# Patient Record
Sex: Female | Born: 1995 | Race: White | Hispanic: No | Marital: Single | Smoking: Never smoker
Health system: Southern US, Community
[De-identification: ages and names within clinical notes are randomized; demographics above are authoritative.]

## PROBLEM LIST (undated history)

## (undated) ENCOUNTER — Emergency Department: Discharge status: Absent without leave | Payer: No Typology Code available for payment source

## (undated) DIAGNOSIS — I10 Essential (primary) hypertension: Secondary | ICD-10-CM

## (undated) DIAGNOSIS — J45909 Unspecified asthma, uncomplicated: Secondary | ICD-10-CM

## (undated) DIAGNOSIS — R55 Syncope and collapse: Secondary | ICD-10-CM

## (undated) DIAGNOSIS — G90A Postural orthostatic tachycardia syndrome (POTS): Secondary | ICD-10-CM

## (undated) DIAGNOSIS — Q796 Ehlers-Danlos syndrome, unspecified: Secondary | ICD-10-CM

## (undated) DIAGNOSIS — Z8619 Personal history of other infectious and parasitic diseases: Secondary | ICD-10-CM

## (undated) DIAGNOSIS — R51 Headache: Secondary | ICD-10-CM

## (undated) DIAGNOSIS — B001 Herpesviral vesicular dermatitis: Secondary | ICD-10-CM

## (undated) DIAGNOSIS — H539 Unspecified visual disturbance: Secondary | ICD-10-CM

## (undated) DIAGNOSIS — F419 Anxiety disorder, unspecified: Secondary | ICD-10-CM

## (undated) DIAGNOSIS — M359 Systemic involvement of connective tissue, unspecified: Secondary | ICD-10-CM

## (undated) DIAGNOSIS — Z8669 Personal history of other diseases of the nervous system and sense organs: Secondary | ICD-10-CM

## (undated) DIAGNOSIS — T7840XA Allergy, unspecified, initial encounter: Secondary | ICD-10-CM

## (undated) DIAGNOSIS — M6751 Plica syndrome, right knee: Secondary | ICD-10-CM

## (undated) HISTORY — DX: Plica syndrome, right knee: M67.51

## (undated) HISTORY — DX: Allergy, unspecified, initial encounter: T78.40XA

## (undated) HISTORY — DX: Anxiety disorder, unspecified: F41.9

## (undated) HISTORY — DX: Herpesviral vesicular dermatitis: B00.1

## (undated) HISTORY — DX: Unspecified visual disturbance: H53.9

## (undated) HISTORY — DX: Headache: R51

## (undated) HISTORY — PX: KNEE SURGERY: SHX244

---
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
## 2016-01-17 ENCOUNTER — Encounter: Payer: Self-pay | Admitting: Pediatrics

## 2016-01-18 ENCOUNTER — Encounter: Payer: Self-pay | Admitting: Pediatrics

## 2016-01-23 ENCOUNTER — Telehealth: Payer: Self-pay

## 2016-01-23 NOTE — Telephone Encounter (Signed)
[facility] - Ambulatory Telephone Intake Screen     Patient Information:    Patient's Name: Jalacia Cowling  Patient's Date of Birth: [birth year]  Patient's Medical Record Number: [id number]  Patient's Home Phone: [phone] (home)  Patient's Work Phone: [phone] (work)  [employer]:   No relevant phone numbers on file.     Patient's Marital Status (if applicable): Single  Can a message be left? Yes      Insurance Information:    Community education officer via [employer]  Policy number:       Referral Source:  Referral Source: [employer] counseling      Presenting Concern:     What is the reason you are seeking care?     (Please provide a brief description of the reasons the patient or referring provider is seeking mental health treatment at this time.  Please use patients own words where possible).    A few years ago I was diagnosed with major depressive disorder, generalized anxiety, & ptsd. When I moved for school I didn't have a counselor or doctor for a whole semester & my mental health got bad. I was in inpatient for a while & then sent back to the counselors at my school & so far none of them have been able to help me.                                    Mental Health History:    Are you currently involved in mental health treatment?  No     Are you currently taking a long acting injectable medication?  No    Will you need an injection at the time of your first intake assessment appointment?   No      Customer Service:    Do you need arrangements for?    Wheelchair: No    Interpreter Services: No

## 2016-02-01 ENCOUNTER — Ambulatory Visit: Payer: Self-pay

## 2016-02-01 DIAGNOSIS — F4312 Post-traumatic stress disorder, chronic: Secondary | ICD-10-CM

## 2016-02-01 DIAGNOSIS — F329 Major depressive disorder, single episode, unspecified: Secondary | ICD-10-CM
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
## 2016-02-15 ENCOUNTER — Ambulatory Visit: Payer: Self-pay

## 2016-02-15 DIAGNOSIS — F4312 Post-traumatic stress disorder, chronic: Secondary | ICD-10-CM

## 2016-02-15 DIAGNOSIS — F32A Depression, unspecified: Secondary | ICD-10-CM

## 2016-02-15 DIAGNOSIS — F411 Generalized anxiety disorder: Secondary | ICD-10-CM

## 2016-02-15 DIAGNOSIS — F603 Borderline personality disorder: Secondary | ICD-10-CM

## 2016-02-15 NOTE — Progress Notes (Signed)
[facility] Safety Plan     Things that cause you to feel stressed:  arguments, being called a name, being isolated/alone, being touched, feeling lonely, feeling worried, not being listened to, people yelling and seeing others out of control    Warning signs:  breathing hard, clenching teeth, hurting others/property, hurting yourself, hyper, isolating/avoiding behavior, not taking care of self, pacing, swearing and sweating/racing heartbeat/breathing hard    Coping strategies:  being around others, breathing exercises, coloring/drawing, crying, exercising, listening to music, meditation/yoga, praying/meditation, reading, screaming in a pillow, snuggling in a blanket, taking a shower, talking to friends/family, using humor, watching television, working, writing down my feelings and writing in a journal    People whom I can ask for help:  Family member:mitch ; phone: [phone]; Ladona MowSarah, ph. [phone]; Luther ParodySidney, [phone]  Keeping a safe environment:  staying with family/friend, removing weapon or firearm from home, removing access to sharp objects and removing alcohol from home    Professionals or agencies I can contact during a crisis:  Clinician Name: Melton Krebsracy Collins ; Phone: [phone] ; De HollingsheadLorrie Earnesteen Birnie [phone]    Clinician Emergency Contact number/Answering Service number: [phone]  [place] OR MOBILE CRISIS: [phone] or [phone]  SUICIDE PREVENTION [place] PHONE: [phone]  POLICE: 911    IF NEEDED, GO TO THE HOSPITAL EMERGENCY ROOM      Patient Signature: ______________________________________________________      Clinician Signature: _____________________________________________________

## 2016-02-16 NOTE — Progress Notes (Signed)
Behavioral Health Progress Note     LENGTH OF SESSION: 45 minutes    Contact Type:  Location: On Site    Face to Face     Problem(s)/Goals Addressed from Treatment Plan:    Problem 1: No flowsheet data found.    Goal for this problem:  No flowsheet data found.    Progress towards this goal: N/A - Initial plan      Mental Status Exam:  APPEARANCE: Appears stated age  ATTITUDE TOWARD INTERVIEWER: Cooperative  MOTOR ACTIVITY: WNL (within normal limits)  EYE CONTACT: Direct  SPEECH: Age appropriate and Rapid  AFFECT: Appropriate  MOOD: Dramatic  THOUGHT PROCESS: Goal directed  THOUGHT CONTENT: No unusual themes  PERCEPTION: Within normal limits  CURRENT SUICIDAL IDEATION: Vague/passive suicidal ideation, Without suicidal plan and Without Intent  CURRENT HOMICIDAL IDEATION: Patient denies  ORIENTATION: Alert and Oriented X 3.  CONCENTRATION: Fair  MEMORY:   Recent: intact   Remote: intact  COGNITIVE FUNCTION: Average intelligence  JUDGMENT: Intact  IMPULSE CONTROL: Good  INSIGHT: Age appropriate    Risk Assessment:  ASSESSMENT OF RISK FOR SUICIDAL BEHAVIOR  Changes in risk for suicide from baseline Formulation of Risk and/or previous intake, including newly identified risk, if any: none      Session Content::  Client came to session identifying specific goals which include developing boundaries with her family and friends.  Increasing her ability to handle distressing situations more effectively.  She also discussed with Clinical research associatewriter that she would like to decrease her anxiety and depression.  We discussed DBT and CBT as a thought challenging intervention and a skill building intervention to her goals.  The session was based on treatment planning. Client stated that she had a "good" week.  We did a safety plan and discussed how she could prepare for the upcoming semester.  Writer explained that she would have to certify her dog with a trainer, but that writer would endorse the therapeutic value of having the dog with her.   Will continue weekly sessions.    Visit Diagnosis:    ICD-10-CM ICD-9-CM   1. Depression F32.9 311   2. GAD (generalized anxiety disorder) F41.1 300.02   3. Chronic post-traumatic stress disorder (PTSD) F43.12 309.81   4. Borderline personality disorder F60.3 301.83       Interventions:  Cognitive Processing Therapy, Continued to develop therapeutic relationship, Dialectical Behavioral Therapy (DBT), Problem Solving Therapy skills, Provided Psychoeducation, Safety Planning.  Refer to safety plan document that was created at today's visit., Treatment Planning    Current Treatment Plan        Plan:  Psychotherapy continues as described in care plan; plan remains the same.    NEXT APPT: Aug 30th @ 4:00      Chaney MallingLORRIE A Yoni Jimenez

## 2016-02-21 ENCOUNTER — Ambulatory Visit: Payer: Self-pay

## 2016-02-21 DIAGNOSIS — F419 Anxiety disorder, unspecified: Secondary | ICD-10-CM

## 2016-02-21 DIAGNOSIS — F329 Major depressive disorder, single episode, unspecified: Secondary | ICD-10-CM

## 2016-02-21 DIAGNOSIS — F4310 Post-traumatic stress disorder, unspecified: Secondary | ICD-10-CM

## 2016-02-21 DIAGNOSIS — F603 Borderline personality disorder: Secondary | ICD-10-CM

## 2016-02-22 NOTE — Progress Notes (Signed)
Behavioral Health Progress Note     LENGTH OF SESSION: 45 minutes    Contact Type:  Location: On Site    Face to Face     Problem(s)/Goals Addressed from Treatment Plan:    Problem 1: No flowsheet data found.    Goal for this problem:  No flowsheet data found.    Progress towards this goal: N/A - Initial plan      Mental Status Exam:  APPEARANCE: Appears stated age  ATTITUDE TOWARD INTERVIEWER: Cooperative  MOTOR ACTIVITY: WNL (within normal limits)  EYE CONTACT: Direct  SPEECH: Normal rate and tone  AFFECT: Appropriate  MOOD: Anxious  THOUGHT PROCESS: Intact  THOUGHT CONTENT: No unusual themes  PERCEPTION: Within normal limits  CURRENT SUICIDAL IDEATION: Without suicidal plan and Without Intent  CURRENT HOMICIDAL IDEATION: Patient denies  ORIENTATION: Alert and Oriented X 3.  CONCENTRATION: WNL  MEMORY:   Recent: intact   Remote: intact  COGNITIVE FUNCTION: Average intelligence  JUDGMENT: Intact  IMPULSE CONTROL: Intact  INSIGHT: Age appropriate    Risk Assessment:  ASSESSMENT OF RISK FOR SUICIDAL BEHAVIOR  Changes in risk for suicide from baseline Formulation of Risk and/or previous intake, including newly identified risk, if any: none      Session Content::  Client brought in a form from [employer] asking for my assessment and why I felt that her dog would be therapeutic.  Writer stated that she felt it would be beneficial in decreasing lethality risk, decreasing depressive symptoms and relieving anxiety.  Writer explained that she could not certify or verify documentation, but was able to give professional assessment.   Client states that she had a very good week did her homework and used her skills.  Client begins school next week and has anxiety surrounding that.  We processed and planned for coping strategies.     Visit Diagnosis:      ICD-10-CM ICD-9-CM   1. Borderline personality disorder F60.3 301.83   2. Major depression F32.9 296.20   3. Anxiety F41.9 300.00   4. PTSD (post-traumatic stress disorder) F43.10  309.81       Interventions:  Cognitive Processing Therapy, Problem Solving Therapy skills, Provided Psychoeducation    Current Treatment Plan        Plan:  Other planned interventions or recommendations: documentation of tx plan    NEXT APPT: 1 week    Kimm Ungaro A Jabbar Palmero

## 2016-03-13 ENCOUNTER — Telehealth: Payer: Self-pay

## 2016-03-13 ENCOUNTER — Ambulatory Visit: Payer: Self-pay

## 2016-03-13 NOTE — Telephone Encounter (Signed)
Writer called client as follow up to missed appointment, client stated she got stuck in lab at [employer] and will reschedule

## 2016-03-21 ENCOUNTER — Emergency Department
Admission: EM | Admit: 2016-03-21 | Discharge: 2016-03-22 | Disposition: A | Discharge status: Home / Self Care | Payer: Self-pay | Source: Ambulatory Visit | Attending: Emergency Medicine | Admitting: Emergency Medicine

## 2016-03-21 DIAGNOSIS — E860 Dehydration: Secondary | ICD-10-CM | POA: Insufficient documentation

## 2016-03-21 DIAGNOSIS — R112 Nausea with vomiting, unspecified: Secondary | ICD-10-CM | POA: Insufficient documentation

## 2016-03-21 DIAGNOSIS — R109 Unspecified abdominal pain: Secondary | ICD-10-CM | POA: Insufficient documentation

## 2016-03-21 LAB — POCT URINALYSIS DIPSTICK
Blood,UA POCT: NEGATIVE
Exp date: 2017
Glucose,UA POCT: NORMAL
Ketones,UA POCT: NEGATIVE
Leuk Esterase,UA POCT: NEGATIVE
Lot #: 16609201
Nitrite,UA POCT: NEGATIVE
PH,UA POCT: 5 (ref 5–8)
Protein,UA POCT: NEGATIVE mg/dL

## 2016-03-21 LAB — CBC AND DIFFERENTIAL
Baso # K/uL: 0.1 10*3/uL (ref 0.0–0.1)
Basophil %: 0.3 %
Eos # K/uL: 0.1 10*3/uL (ref 0.0–0.4)
Eosinophil %: 0.5 %
Hematocrit: 37 % (ref 34–45)
Hemoglobin: 12.2 g/dL (ref 11.2–15.7)
IMM Granulocytes #: 0.1 10*3/uL (ref 0.0–0.1)
IMM Granulocytes: 0.5 %
Lymph # K/uL: 1.1 10*3/uL — ABNORMAL LOW (ref 1.2–3.7)
Lymphocyte %: 6.6 %
MCH: 25 pg — ABNORMAL LOW (ref 26–32)
MCHC: 33 g/dL (ref 32–36)
MCV: 77 fL — ABNORMAL LOW (ref 79–95)
Mono # K/uL: 0.8 10*3/uL (ref 0.2–0.9)
Monocyte %: 4.5 %
Neut # K/uL: 15 10*3/uL — ABNORMAL HIGH (ref 1.6–6.1)
Nucl RBC # K/uL: 0 10*3/uL (ref 0.0–0.0)
Nucl RBC %: 0 /100 WBC (ref 0.0–0.2)
Platelets: 246 10*3/uL (ref 160–370)
RBC: 4.8 MIL/uL (ref 3.9–5.2)
RDW: 13.9 % (ref 11.7–14.4)
Seg Neut %: 87.6 %
WBC: 17.1 10*3/uL — ABNORMAL HIGH (ref 4.0–10.0)

## 2016-03-21 LAB — POCT URINE PREGNANCY
Exp date: 20
Lot #: 122677

## 2016-03-21 NOTE — ED Notes (Signed)
Patient presents to ED with c/o of RLQ and LLQ abdominal pain, nausea, vomiting, diarrhea, joint pain and sore throat for the last 2 weeks.

## 2016-03-21 NOTE — ED Notes (Signed)
Requested a urine sample, pt ambulated to the bathroom on own accord.

## 2016-03-22 ENCOUNTER — Encounter: Payer: Self-pay | Admitting: Emergency Medicine

## 2016-03-22 LAB — BASIC METABOLIC PANEL
Anion Gap: 16 (ref 7–16)
CO2: 22 mmol/L (ref 20–28)
Calcium: 9.5 mg/dL (ref 9.0–10.4)
Chloride: 102 mmol/L (ref 96–108)
Creatinine: 0.83 mg/dL (ref 0.50–1.00)
GFR,Black: 118 *
GFR,Caucasian: 102 *
Glucose: 98 mg/dL (ref 60–99)
Lab: 9 mg/dL (ref 6–20)
Potassium: 3.9 mmol/L (ref 3.3–5.1)
Sodium: 140 mmol/L (ref 133–145)

## 2016-03-22 LAB — RUQ PANEL (ED ONLY)
ALT: 20 U/L (ref 0–35)
AST: 24 U/L (ref 0–35)
Albumin: 4.5 g/dL (ref 3.5–5.2)
Alk Phos: 81 U/L (ref 50–130)
Amylase: 64 U/L (ref 28–100)
Bilirubin,Direct: <0.2 mg/dL (ref 0.0–0.3)
Bilirubin,Total: 0.6 mg/dL (ref 0.0–1.2)
Globulin: 2.5 g/dL — ABNORMAL LOW (ref 2.7–4.3)
Lipase: 17 U/L (ref 13–60)
Total Protein: 7 g/dL (ref 6.3–7.7)

## 2016-03-22 LAB — CBC AND DIFFERENTIAL
Baso # K/uL: 0 10*3/uL (ref 0.0–0.1)
Basophil %: 0.2 %
Eos # K/uL: 0.1 10*3/uL (ref 0.0–0.4)
Eosinophil %: 0.3 %
Hematocrit: 33 % — ABNORMAL LOW (ref 34–45)
Hemoglobin: 10.9 g/dL — ABNORMAL LOW (ref 11.2–15.7)
IMM Granulocytes #: 0.1 10*3/uL (ref 0.0–0.1)
IMM Granulocytes: 0.7 %
Lymph # K/uL: 1 10*3/uL — ABNORMAL LOW (ref 1.2–3.7)
Lymphocyte %: 6.3 %
MCH: 26 pg (ref 26–32)
MCHC: 33 g/dL (ref 32–36)
MCV: 78 fL — ABNORMAL LOW (ref 79–95)
Mono # K/uL: 0.9 10*3/uL (ref 0.2–0.9)
Monocyte %: 6.2 %
Neut # K/uL: 13.1 10*3/uL — ABNORMAL HIGH (ref 1.6–6.1)
Nucl RBC # K/uL: 0 10*3/uL (ref 0.0–0.0)
Nucl RBC %: 0 /100 WBC (ref 0.0–0.2)
Platelets: 207 10*3/uL (ref 160–370)
RBC: 4.2 MIL/uL (ref 3.9–5.2)
RDW: 14 % (ref 11.7–14.4)
Seg Neut %: 86.3 %
WBC: 15.1 10*3/uL — ABNORMAL HIGH (ref 4.0–10.0)

## 2016-03-22 LAB — RAPID STREP SCREEN: Rapid Strep Group A Throat: 0

## 2016-03-22 LAB — SEDIMENTATION RATE, AUTOMATED: Sedimentation Rate: 8 mm/hr (ref 0–20)

## 2016-03-22 LAB — CRP: CRP: 14 mg/L — ABNORMAL HIGH (ref 0–10)

## 2016-03-22 MED ORDER — MORPHINE SULFATE 4 MG/ML IV SOLN *WRAPPED*
4.0000 mg | Freq: Once | INTRAVENOUS | Status: AC
Start: 2016-03-22 — End: 2016-03-22
  Administered 2016-03-22: 4 mg via INTRAVENOUS
  Filled 2016-03-22: qty 1

## 2016-03-22 MED ORDER — SODIUM CHLORIDE 0.9 % IV BOLUS *I*
1000.0000 mL | Freq: Once | Status: AC
Start: 2016-03-22 — End: 2016-03-22
  Administered 2016-03-22: 1000 mL via INTRAVENOUS

## 2016-03-22 MED ORDER — KETOROLAC TROMETHAMINE 30 MG/ML IJ SOLN *I*
30.0000 mg | Freq: Once | INTRAMUSCULAR | Status: AC
Start: 2016-03-22 — End: 2016-03-22
  Administered 2016-03-22: 30 mg via INTRAVENOUS
  Filled 2016-03-22: qty 1

## 2016-03-22 MED ORDER — IOHEXOL 350 MG/ML (OMNIPAQUE) IV SOLN *I*
1.0000 mL | Freq: Once | INTRAVENOUS | Status: AC
Start: 2016-03-22 — End: 2016-03-22
  Administered 2016-03-22: 114 mL via INTRAVENOUS

## 2016-03-22 MED ORDER — STERILE WATER FOR IRRIGATION IR SOLN *I*
900.0000 mL | Freq: Once | Status: AC
Start: 2016-03-22 — End: 2016-03-22
  Administered 2016-03-22: 900 mL via ORAL

## 2016-03-22 MED ORDER — ONDANSETRON HCL 2 MG/ML IV SOLN *I*
4.0000 mg | Freq: Once | INTRAMUSCULAR | Status: AC
Start: 2016-03-22 — End: 2016-03-22
  Administered 2016-03-22: 4 mg via INTRAVENOUS
  Filled 2016-03-22: qty 2

## 2016-03-22 MED ORDER — PROMETHAZINE HCL 25 MG/ML IJ SOLN *I*
12.5000 mg | Freq: Once | INTRAMUSCULAR | Status: AC
Start: 2016-03-22 — End: 2016-03-22
  Administered 2016-03-22: 12.5 mg via INTRAVENOUS
  Filled 2016-03-22: qty 1

## 2016-03-22 NOTE — ED Notes (Signed)
Patient had large amount of vomiting after drinking contrast. Alveria ApleyM Caccamise NP aware.

## 2016-03-22 NOTE — ED Provider Notes (Signed)
History     Chief Complaint   Patient presents with    Abdominal Pain     pt states was sick a few weeks ago for 24 hours. since then has had cough, abdominal pain, nausea vomiting. today got dizzy and "had a head rush". also states joint pain "throught the charts". states home temp was 102. states neck pain and throat pain for few days.    Joint Pain    Fever    Dizziness     HPI Comments: Patient is a 20 year old female with a history of depression, anxiety, fibromyalgia who presents to the emergency department complaints of abdominal pain.  Patient reports that she's had intermittent abdominal pain for 2 weeks.  Patient reports lower abdominal pain with nausea and vomiting.  Denies any diarrhea.  Patient denies any urinary symptoms or vaginal discharge.  Unknown pregnancy status.  Patient reports fevers, chills, myalgias.  Denies any chest pain or shortness of breath.  Patient also reports that she's had a sore throat for 2 weeks.  Denies any difficulty breathing or swallowing.  Unknown sick contacts.      History provided by:  Patient  Language interpreter used: No      Past Medical History:   Diagnosis Date    Asthma     Ehlers-Danlos syndrome     Hypertension     POTS (postural orthostatic tachycardia syndrome)     Vasovagal syncope         History reviewed. No pertinent surgical history.  History reviewed. No pertinent family history.    Social History    reports that she has never smoked. She does not have any smokeless tobacco history on file. She reports that she drinks alcohol. She reports that she uses illicit drugs, including Marijuana. Her sexual activity history is not on file.    Living Situation     Questions Responses    Patient lives with Alone    Homeless No    Caregiver for other family member No    External Services Mental Health Services    Employment Student    Comment: [employer]     Domestic Violence Risk No          Problem List     Patient Active Problem List   Diagnosis Code     Sprain of left ankle, unspecified ligament, initial encounter S93.402A    Major depress dis, severe F32.2       Review of Systems   Review of Systems   Constitutional: Positive for chills, fatigue and fever. Negative for activity change, appetite change, diaphoresis and unexpected weight change.   HENT: Positive for sore throat. Negative for ear discharge, ear pain, trouble swallowing and voice change.    Eyes: Negative for photophobia and visual disturbance.   Respiratory: Negative for apnea, cough, choking, chest tightness, shortness of breath, wheezing and stridor.    Cardiovascular: Negative for chest pain, palpitations and leg swelling.   Gastrointestinal: Positive for abdominal pain, nausea and vomiting. Negative for abdominal distention, constipation and diarrhea.   Endocrine: Negative.    Genitourinary: Negative for dysuria, flank pain, frequency, hematuria, urgency and vaginal discharge.   Musculoskeletal: Positive for myalgias. Negative for arthralgias, back pain, gait problem, joint swelling, neck pain and neck stiffness.   Skin: Negative for color change, pallor, rash and wound.   Allergic/Immunologic: Negative.    Neurological: Negative for dizziness, weakness, light-headedness and headaches.   Hematological: Negative.    Psychiatric/Behavioral: Negative for self-injury and  suicidal ideas.   All other systems reviewed and are negative.      Physical Exam     ED Triage Vitals   BP Heart Rate Heart Rate (via Pulse Ox) Resp Temp Temp src SpO2 O2 Device O2 Flow Rate   03/21/16 2215 03/21/16 2215 03/22/16 0000 03/21/16 2215 03/21/16 2215 03/21/16 2215 03/21/16 2215 03/21/16 2215 --   132/66 135 109 18 36.7 C (98.1 F) TEMPORAL 99 % None (Room air)       Weight           03/21/16 2215           77.1 kg (170 lb)                    Physical Exam   Constitutional: She is oriented to person, place, and time. She appears well-developed. No distress.   HENT:   Head: Normocephalic.   Mouth/Throat: Oropharynx is  clear and moist. No trismus in the jaw. No oropharyngeal exudate, posterior oropharyngeal edema, posterior oropharyngeal erythema or tonsillar abscesses.   Eyes: Conjunctivae and EOM are normal. Pupils are equal, round, and reactive to light.   Neck: Normal range of motion. Neck supple.   Cardiovascular: Regular rhythm and normal heart sounds.  Tachycardia present.    Pulmonary/Chest: Effort normal and breath sounds normal. No respiratory distress. She has no wheezes. She has no rales. She exhibits no tenderness.   Abdominal: Soft. Bowel sounds are normal. She exhibits no distension. There is tenderness.   Abdomen soft, nondistended, lower abdominal tenderness without rebound or guarding   Genitourinary:   Genitourinary Comments: Declined pelvic exam, declined self swabbing   Musculoskeletal: Normal range of motion. She exhibits no edema, tenderness or deformity.   Neurological: She is alert and oriented to person, place, and time.   Skin: Skin is warm and dry. No rash noted. She is not diaphoretic. No erythema. No pallor.   Psychiatric: She has a normal mood and affect. Her behavior is normal. Judgment and thought content normal.   Nursing note and vitals reviewed.      Medical Decision Making      Amount and/or Complexity of Data Reviewed  Clinical lab tests: ordered and reviewed  Tests in the radiology section of CPT: reviewed and ordered        Initial Evaluation:  ED First Provider Contact     Date/Time Event User Comments    03/21/16 2307 ED Provider First Contact Shanetra Blumenstock Initial Face to Face Provider Contact          Patient seen by me on arrival date of 03/21/2016 at at time of arrival  10:03 PM.  Initial face to face evaluation time noted above may be discrepant due to patient acuity and delay in documentation.    Assessment:  20 y.o.female comes to the ED with Complaint of abdominal pain, nausea, vomiting, sore throat    Differential Diagnosis includes gastroenteritis, intra-abdominal infection,  UTI, STD, PID, strep pharyngitis, pharyngitis, viral illness                      Plan:  Urine/pregnancy  CBC and diff  BMP  Right upper quadrant  CRP  Sed rate  Rapid strep  IV hydration  Morphine  Zofran  Toradol    Labs notable for leukocytosis.  Negative pregnancy.  Labs Reviewed   CBC AND DIFFERENTIAL - Abnormal; Notable for the following:        Result  Value    WBC 17.1 (*)     MCV 77 (*)     MCH 25 (*)     Neut # K/uL 15.0 (*)     Lymph # K/uL 1.1 (*)     All other components within normal limits   RUQ PANEL (ED ONLY) - Abnormal; Notable for the following:     Globulin 2.5 (*)     All other components within normal limits   CRP - Abnormal; Notable for the following:     CRP 14 (*)     All other components within normal limits   CBC AND DIFFERENTIAL - Abnormal; Notable for the following:     WBC 15.1 (*)     Hemoglobin 10.9 (*)     Hematocrit 33 (*)     MCV 78 (*)     Neut # K/uL 13.1 (*)     Lymph # K/uL 1.0 (*)     All other components within normal limits   RAPID STREP SCREEN    Narrative:     Negative for Streptococcus Group A Antigen  Sensitivity for a Rapid Test for GABHS compared to  culture is 80% (Dec. 2016-Feb. 2017 data).  Test Method: Immunoassay   STREP A CULTURE, THROAT    Narrative:     Testing in progress   BASIC METABOLIC PANEL   CRP   SEDIMENTATION RATE, AUTOMATED   SEDIMENTATION RATE, AUTOMATED   POCT URINALYSIS DIPSTICK   POCT URINE PREGNANCY     Plan for CT abdomen and pelvis  CT showed:  Ct Abdomen And Pelvis With Contrast    Result Date: 03/22/2016  Exam Site: [facility] 03/22/2016 3:40 AM  CT ABDOMEN AND PELVIS WITH CONTRAST  ORDERING CLINICAL INFORMATION:  ERECORD: ABDOMINAL PAIN;lower abdominal pain with leukocytosis ADDITIONAL CLINICAL INFORMATION:  None.   COMPARISON:  None.  PROCEDURE: Oral contrast was administered. CT images were acquired from the lung bases through the pelvis after administration of Omnipaque 350 IV contrast. Contrast amount:  114 cc.    Coronal and sagittal reformats were created.  Chest Base findings: Visualized portion of the lung bases are unremarkable.  ABDOMEN FINDINGS:  Liver/Biliary Tract:  No focal liver lesions.  No gallbladder wall thickening. No biliary ductal dilation.   Pancreas: Normal  Spleen: Normal. Small splenule noted.  Adrenals: Normal  Kidneys and Collecting Systems: No hydronephrosis. Symmetric enhancement of bilateral kidneys.  Stomach/Esophagus/Abdominal GI Tract: Stomach is unremarkable. Small bowel is unremarkable.  Mesentery and Peritoneal Cavity: No free air. No free fluid.  Vessels: Normal aortic caliber.  Lymph Nodes: No lymphadenopathy.  Soft Tissues/Musculoskeletal:  No acute bone abnormality.  PELVIC FINDINGS:  Visualized Reproductive Organs: Normal  Bladder: Normal  Lymph Nodes: No lymphadenopathy.  Vessels: Normal  Pelvic GI Tract:  No free fluid. Large bowel is unremarkable.  Normal-appearing appendix.  Soft Tissues/Musculoskeletal:  No acute bone abnormality.       IMPRESSION:  Unremarkable CT of the abdomen and pelvis. No findings to explain patient's abdominal pain.  END REPORT  I have personally reviewed the image(s) and the resident's interpretation and agree with or edited the findings.      Patient received 2 L normal saline.  Patient reports that she feels better.  Repeat WBC decreased from 17.1-15.1.  Patient declined pelvic exam, declined self swabbing.  Patient reports that she has no vaginal discharge and is confident that she does not have an STD.  Patient reports that she feels better  and would like to go home.  Plan to discharge patient home with strict return precautions.  Patient educated to follow up with [facility] and PCP next week.  She is agreeable with this plan and understands return precautions.  Curlene Dolphin, NP    Collaborating physician Rada Hay was immediately available       Curlene Dolphin, NP  03/22/16 2032

## 2016-03-22 NOTE — Discharge Instructions (Signed)
Take tylenol for pain   Stay hydrated     Follow-up with your PCP and return to the ED with worsening symptoms or concerns

## 2016-03-23 LAB — STREP A CULTURE, THROAT: Group A Strep Throat Culture: 0

## 2016-03-25 ENCOUNTER — Ambulatory Visit: Payer: Self-pay

## 2016-03-25 DIAGNOSIS — F329 Major depressive disorder, single episode, unspecified: Secondary | ICD-10-CM

## 2016-03-25 DIAGNOSIS — F603 Borderline personality disorder: Secondary | ICD-10-CM

## 2016-03-25 DIAGNOSIS — F4312 Post-traumatic stress disorder, chronic: Secondary | ICD-10-CM

## 2016-03-26 DIAGNOSIS — F4312 Post-traumatic stress disorder, chronic: Secondary | ICD-10-CM

## 2016-03-26 DIAGNOSIS — F329 Major depressive disorder, single episode, unspecified: Secondary | ICD-10-CM

## 2016-03-26 DIAGNOSIS — F603 Borderline personality disorder: Secondary | ICD-10-CM

## 2016-03-26 NOTE — Progress Notes (Signed)
Behavioral Health Progress Note     LENGTH OF SESSION: 45 minutes    Contact Type:  Location: On Site    Face to Face     Problem(s)/Goals Addressed from Treatment Plan:    Problem 1:   Treatment Problem #1 03/26/2016   Patient Identified Problem Inability to regulate emotions       Goal for this problem:    Treatment Goal #1 03/26/2016   Patient Identified Goal emotion regulation       Progress towards this goal: N/A - Initial planProblem 2:    Treatment Problem #2 03/26/2016   Patient Identified Problem clear boundaries with Family and Friends       Goal for this problem:    Treatment Goal #2 03/26/2016   Patient Identified Goal developing boundaries        Progress towards this goal: N/A - Initial plan  , Problem 3:    Treatment Problem #3 03/26/2016   Patient Identified Problem Depression       Goal for this problem:    Treatment Goal #3 03/26/2016   Patient Identified Goal reduce depression       Progress towards this goal: N/A - Initial plan    ,       Mental Status Exam:  APPEARANCE: Appears stated age  ATTITUDE TOWARD INTERVIEWER: Cooperative  MOTOR ACTIVITY: WNL (within normal limits)  EYE CONTACT: Direct  SPEECH: Normal rate and tone  AFFECT: Anxious, Fearful and Depressed  MOOD: Anxious and Sad  THOUGHT PROCESS: Goal directed  THOUGHT CONTENT: No unusual themes  PERCEPTION: Within normal limits  CURRENT SUICIDAL IDEATION: patient denies  CURRENT HOMICIDAL IDEATION: Patient denies  ORIENTATION: Alert and Oriented X 3.  CONCENTRATION: WNL  MEMORY:   Recent: intact   Remote: intact  COGNITIVE FUNCTION: Average intelligence  JUDGMENT: Intact  IMPULSE CONTROL: Fair  INSIGHT: Age appropriate    Risk Assessment:  ASSESSMENT OF RISK FOR SUICIDAL BEHAVIOR  Changes in risk for suicide from baseline Formulation of Risk and/or previous intake, including newly identified risk, if any: none      Session Content::  Client came to session on time and very dysregulated.  She stated that she was no longer insured and that she  had been very sick for a week.  She stated that there was a mix up with [employer] and that her insurance was a mess. She further stated that she would not be able to be insured by parents while [place] is [place].   Writer first did a mindfulness exercise with client to bring her back into the moment and to calm her.   She was then able to problem solve with Clinical research associate.  We were able to have [employer] take the money out of her financial aid and she was instructed to go ahead to the Dr.and that her insurance would be affective immediately. Client did the negotiation during session with writer there to coach if necessary, but client was able to problem solve effectively.  We spent the rest of the session processing adaptive skill utilization and how ineffective emotion mind was in problem solving.  We then spent some time on how she was coping with anxiety from classes and she stated that she uses mindfulness and breathing and that it has been effective.  We will meet in two weeks.  Writer gave client some homework that included the situation from session and she would identify the thoughts that led to the emotion and the subsequent  behavior.    Visit Diagnosis:    ICD-10-CM ICD-9-CM   1. Chronic post-traumatic stress disorder (PTSD) F43.12 309.81   2. MDD (major depressive disorder) F32.9 296.20   3. Borderline personality disorder F60.3 301.83       Interventions:  Cognitive Processing Therapy, Dialectical Behavioral Therapy (DBT), Problem Solving Therapy skills, Provided Psychoeducation    Current Treatment Plan   Created/Updated On 03/26/2016   Next Treatment Plan Due 06/25/2016         Plan:  Psychotherapy continues as described in care plan; plan remains the same.    NEXT APPT: 2 weeks    Galadriel Shroff A Anzal Bartnick

## 2016-03-26 NOTE — BH Treatment Plan (Addendum)
[facility] Treatment [facility]     Date of Plan:   Created/Updated On 03/26/2016   FROM 03/26/2016      Created/Updated On 03/26/2016   Next Treatment Plan Due 06/25/2016       Diagnostic Impression    ICD-10-CM ICD-9-CM   1. Chronic post-traumatic stress disorder (PTSD) F43.12 309.81   2. MDD (major depressive disorder) F32.9 296.20   3. Borderline personality disorder F60.3 301.83       Strengths  Strengths derived from the assessment include: client is intellegent and self sufficient.  She has maintained the role of psychological caregiver in her family.  She is proactive in her treatment and is engaged in therapy.  She is future and goal oriented with support of friends and roommate.  Problem Areas  *At least one problem must be targeted toward risk reduction if Formulation of Risk or any other previous exam indicated special monitoring or intervention for suicide and/or violence risk indicated.    PROBLEM AREAS (choose and describe relevant):  THOUGHT: negative self talk  MOOD:depression and anger  BEHAVIOR: impulsivity and isolation  ________________________________________________________________  Treatment Problem #1 03/26/2016   Patient Identified Problem Inability to regulate emotions        Treatment Goal #1 03/26/2016   Patient Identified Goal emotion regulation       The rationale for addressing this problem is that resolving it will (select all that apply):  Reduce symptoms of disorder, Reduce functional impairment associated with disorder, Decrease likelihood of hospitalization, Facilitate transfer skills learned in therapy to everday life, Is a key motivational factor for the patient's participation in treatment and Reduce risk for suicide      Progress toward goal(s): N/A - Initial plan    1 a. Measurable Objectives : Client will develop skills to regulate intense emotion through DBT training and Psychoeducational CBT, measurable through self report and diary cards.   Date established:  03/26/16   Target date: 06/25/16   Attained or Revised? new        Treatment Problem #2 03/26/2016   Patient Identified Problem clear boundaries with Family and Friends       Treatment Goal #2 03/26/2016   Patient Identified Goal developing boundaries       The rationale for addressing this problem is that resolving it will (select all that apply):  Reduce symptoms of disorder, Reduce functional impairment associated with disorder, Decrease likelihood of hospitalization, Facilitate transfer skills learned in therapy to everday life, Is a key motivational factor for the patient's participation in treatment and Reduce risk for suicide    Progress toward goal(s): N/A - Initial plan    2 a. Measurable Objectives : Client will develop clear boundaries through psychoeducational CBT measureable through self report and interaction with family and friends.   Date established: 03/26/16   Target date: 06/25/16   Attained or Revised? new  ..........................................................................................................................................    Treatment Problem #3 03/26/2016   Patient Identified Problem Depression       Treatment Goal #3 03/26/2016   Patient Identified Goal reduce depression       The rationale for addressing this problem is that resolving it will (select all that apply):  Reduce symptoms of disorder, Reduce functional impairment associated with disorder, Decrease likelihood of hospitalization, Facilitate transfer skills learned in therapy to everday life, Is a key motivational factor for the patient's participation in treatment and Reduce risk for suicide    Progress toward goal(s): N/A - Initial plan  3 a. Measurable Objectives : client will develop skills to decrease depression through psychoeducation which will include emotion regulation , distress tolerance, and CBT worksheet for depression. Measurable on the PHQ-9 score, compared to baseline.   Date established:  03/26/16   Target date: 06/25/16   Attained or Revised? new      ______________________________________________________________________      Plan  TREATMENT MODALITIES:  Individual psychotherapy for 45 min Q 2weeks with De Hollingshead    DISCHARGE CRITERIA for this treatment setting: goal attainment  Clinician's name: Chaney Malling    Psychiatrist's Name: Dr. Elyse Hsu Name: Dr. Brett Albino    Patient/Family Statement  PATIENT/FAMILY STATEMENT:  Obtain patient and family input into the treatment plan, including areas of agreement / disagreement.  Obtain patient's signature - if not possible, briefly describe the reason.     Patient Comments:          I HAVE PARTICIPATED IN THE DEVELOPMENT OF THIS TREATMENT PLAN AND I AGREE WITH ITS CONTENTS:       Patient Signature: _______________________________________________________    Date: ______________________________

## 2016-04-10 ENCOUNTER — Ambulatory Visit: Payer: Self-pay

## 2016-04-11 ENCOUNTER — Ambulatory Visit: Payer: Self-pay

## 2016-04-11 DIAGNOSIS — F609 Personality disorder, unspecified: Secondary | ICD-10-CM

## 2016-04-11 DIAGNOSIS — F411 Generalized anxiety disorder: Secondary | ICD-10-CM

## 2016-04-11 DIAGNOSIS — F4310 Post-traumatic stress disorder, unspecified: Secondary | ICD-10-CM

## 2016-04-15 NOTE — Progress Notes (Signed)
Behavioral Health Progress Note     LENGTH OF SESSION: 45 minutes    Contact Type:  Location: On Site    Face to Face     Problem(s)/Goals Addressed from Treatment Plan:    Problem 1:   Treatment Problem #1 03/26/2016   Patient Identified Problem Inability to regulate emotions       Goal for this problem:    Treatment Goal #1 03/26/2016   Patient Identified Goal emotion regulation       Progress towards this goal: Problem resolving. Comment: Client was able to defer responsiblity back to parents when they made a request of her , Problem 2:    Treatment Problem #2 03/26/2016   Patient Identified Problem clear boundaries with Family and Friends       Goal for this problem:    Treatment Goal #2 03/26/2016   Patient Identified Goal developing boundaries        Progress towards this goal: N/A - Initial plan          Mental Status Exam:  APPEARANCE: Appears stated age, Casual  ATTITUDE TOWARD INTERVIEWER: Cooperative  MOTOR ACTIVITY: WNL (within normal limits)  EYE CONTACT: Direct  SPEECH: Normal rate and tone  AFFECT: Appropriate  MOOD: Lively  THOUGHT PROCESS: Goal directed  THOUGHT CONTENT: No unusual themes  PERCEPTION: Within normal limits  CURRENT SUICIDAL IDEATION: patient denies  CURRENT HOMICIDAL IDEATION: Patient denies  ORIENTATION: Alert and Oriented X 3.  CONCENTRATION: WNL  MEMORY:   Recent: intact   Remote: intact  COGNITIVE FUNCTION: Average intelligence  JUDGMENT: Intact  IMPULSE CONTROL: Fair  INSIGHT: Age appropriate    Risk Assessment:  ASSESSMENT OF RISK FOR SUICIDAL BEHAVIOR  Changes in risk for suicide from baseline Formulation of Risk and/or previous intake, including newly identified risk, if any: none      Session Content::  Client arrived on time for session.  Client stated that her current stressor was that she was afraid of dropping out of [employer]. She has to pass her art class in order to make it into the next semester.  She reports that she is doing well in all her other classes.  Client states  that she struggles and we discussed how she could be proactive by accessing the professor and any other resources in order to advance her skills.  We discussed self care and DBT please master skills.  Client will implement skills in order to cope effectively with the stress of college and to keep her focus healthy.  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  Client left optimistic and ready to reach out to her professor.  Writer assessed SI and client stated that she did not have any thoughts or intent, she was goal and future oriented.  We did a mindfulness exercise to decrease anxiety and session concluded.      Visit Diagnosis:    ICD-10-CM ICD-9-CM   1. PTSD (post-traumatic stress disorder) F43.10 309.81   2. Personality disorder F60.9 301.9   3. GAD (generalized anxiety disorder) F41.1 300.02       Interventions:  Cognitive Processing Therapy, Dialectical Behavioral Therapy (DBT), Problem Solving Therapy skills, Provided Psychoeducation, Supportive Psychotherapy    Current Treatment Plan   Created/Updated On 03/26/2016   Next Treatment Plan Due 06/25/2016         Plan:  Psychotherapy  continues as described in care plan; plan remains the same.    NEXT APPT: 2 weeks      Chaney Malling, LMSW

## 2016-04-25 ENCOUNTER — Telehealth: Payer: Self-pay

## 2016-04-25 ENCOUNTER — Ambulatory Visit: Payer: Self-pay
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
## 2016-05-01 ENCOUNTER — Ambulatory Visit: Payer: Self-pay

## 2016-05-01 DIAGNOSIS — F419 Anxiety disorder, unspecified: Secondary | ICD-10-CM

## 2016-05-01 DIAGNOSIS — F4310 Post-traumatic stress disorder, unspecified: Secondary | ICD-10-CM

## 2016-05-01 DIAGNOSIS — F32A Depression, unspecified: Secondary | ICD-10-CM
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
## 2016-05-08 ENCOUNTER — Ambulatory Visit: Payer: Self-pay

## 2016-05-08 DIAGNOSIS — F32A Depression, unspecified: Secondary | ICD-10-CM

## 2016-05-08 DIAGNOSIS — F4310 Post-traumatic stress disorder, unspecified: Secondary | ICD-10-CM
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
## 2016-05-27 ENCOUNTER — Telehealth: Payer: Self-pay

## 2016-05-27 NOTE — Telephone Encounter (Signed)
Client wanted later appt and writer was able to put her in at 2:45 05/28/16

## 2016-05-28 ENCOUNTER — Ambulatory Visit: Payer: Self-pay

## 2016-05-28 DIAGNOSIS — F32A Depression, unspecified: Secondary | ICD-10-CM

## 2016-05-28 DIAGNOSIS — F4310 Post-traumatic stress disorder, unspecified: Secondary | ICD-10-CM

## 2016-05-29 NOTE — Progress Notes (Signed)
Behavioral Health Progress Note     LENGTH OF SESSION: 45 minutes    Contact Type:  Location: On Site    Face to Face     Problem(s)/Goals Addressed from Treatment Plan:    Problem 1:   Treatment Problem #1 03/26/2016   Patient Identified Problem Inability to regulate emotions       Goal for this problem:    Treatment Goal #1 03/26/2016   Patient Identified Goal emotion regulation       Progress towards this goal: Problem resolving. Comment: Client was able to defer responsiblity back to parents when they made a request of her , Problem 2:    Treatment Problem #2 03/26/2016   Patient Identified Problem clear boundaries with Family and Friends       Goal for this problem:    Treatment Goal #2 03/26/2016   Patient Identified Goal developing boundaries        Progress towards this goal: N/A - Initial plan          Mental Status Exam:  APPEARANCE: Appears stated age, Casual  ATTITUDE TOWARD INTERVIEWER: Cooperative  MOTOR ACTIVITY: WNL (within normal limits)  EYE CONTACT: Direct  SPEECH: Normal rate and tone  AFFECT: Appropriate  MOOD: Lively  THOUGHT PROCESS: Goal directed  THOUGHT CONTENT: No unusual themes  PERCEPTION: Within normal limits  CURRENT SUICIDAL IDEATION: patient denies  CURRENT HOMICIDAL IDEATION: Patient denies  ORIENTATION: Alert and Oriented X 3.  CONCENTRATION: WNL  MEMORY:   Recent: intact   Remote: intact  COGNITIVE FUNCTION: Average intelligence  JUDGMENT: Intact  IMPULSE CONTROL: Fair  INSIGHT: Age appropriate    Risk Assessment:  ASSESSMENT OF RISK FOR SUICIDAL BEHAVIOR  Changes in risk for suicide from baseline Formulation of Risk and/or previous intake, including newly identified risk, if any: none      Session Content::  Client arrived on time for session. . Client stated that she had completed her hearing and they placed her on probation.  She is taking a semester off so when she returns the probation will be lifted.  PrintmakerClient and writer processed the direction she would like to take which  includes [place] in [place].  She is planning on returning home for the holiday and then coming back to [place] to [place], and attend either [employer] or [employer].  Client has shifted focus to health sciences and will continue that path.  Writer and client began termination and we discussed reopening if she were to ever need therapy in the future.   Writer and client processed her use of skills and the progress she has made in therapy.  We scheduled one last meeting [place]       Visit Diagnosis:      ICD-10-CM ICD-9-CM   1. PTSD (post-traumatic stress disorder) F43.10 309.81   2. Depression F32.9 311       Interventions:  Cognitive Processing Therapy, Dialectical Behavioral Therapy (DBT), Problem Solving Therapy skills, Provided Psychoeducation, Supportive Psychotherapy    Current Treatment Plan   Created/Updated On 03/26/2016   Next Treatment Plan Due 06/25/2016         Plan:  Psychotherapy continues as described in care plan; plan remains the same.    NEXT APPT: 2 weeks      Chaney MallingLORRIE A Braxdon Gappa, LMSW

## 2016-06-11 ENCOUNTER — Ambulatory Visit: Payer: Self-pay

## 2016-06-11 DIAGNOSIS — F32A Depression, unspecified: Secondary | ICD-10-CM

## 2016-06-11 DIAGNOSIS — F419 Anxiety disorder, unspecified: Secondary | ICD-10-CM

## 2016-06-11 DIAGNOSIS — F4310 Post-traumatic stress disorder, unspecified: Secondary | ICD-10-CM

## 2016-06-12 NOTE — Progress Notes (Addendum)
Behavioral Health Progress Note     LENGTH OF SESSION: 45 minutes    Contact Type:  Location: On Site    Face to Face     Problem(s)/Goals Addressed from Treatment Plan:    Problem 1:   Treatment Problem #1 03/26/2016   Patient Identified Problem Inability to regulate emotions       Goal for this problem:    Treatment Goal #1 03/26/2016   Patient Identified Goal emotion regulation       Progress towards this goal: Problem resolving. Comment: Client was able to defer responsiblity back to parents when they made a request of her , Problem 2:    Treatment Problem #2 03/26/2016   Patient Identified Problem clear boundaries with Family and Friends       Goal for this problem:    Treatment Goal #2 03/26/2016   Patient Identified Goal developing boundaries        Progress towards this goal: N/A - Initial plan          Mental Status Exam:  APPEARANCE: Appears stated age, Casual  ATTITUDE TOWARD INTERVIEWER: Cooperative  MOTOR ACTIVITY: WNL (within normal limits)  EYE CONTACT: Direct  SPEECH: Normal rate and tone  AFFECT: Appropriate  MOOD: Lively  THOUGHT PROCESS: Goal directed  THOUGHT CONTENT: No unusual themes  PERCEPTION: Within normal limits  CURRENT SUICIDAL IDEATION: patient denies  CURRENT HOMICIDAL IDEATION: Patient denies  ORIENTATION: Alert and Oriented X 3.  CONCENTRATION: WNL  MEMORY:   Recent: intact   Remote: intact  COGNITIVE FUNCTION: Average intelligence  JUDGMENT: Intact  IMPULSE CONTROL: Fair  INSIGHT: Age appropriate    Risk Assessment:  ASSESSMENT OF RISK FOR SUICIDAL BEHAVIOR  Changes in risk for suicide from baseline Formulation of Risk and/or previous intake, including newly identified risk, if any: none      Session Content::  Client arrived on time for session.  PrintmakerClient and writer processed the direction she would take which includes [place] in [place].  [place] to [place] to [place], and at some point will attend either [employer] or [employer].  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  Writer and client began termination and we discussed reopening if she were to ever need therapy in the future.   Writer and client processed her use of skills and the progress she has made in therapy, and writer wished her the best in the future.      Visit Diagnosis:      ICD-10-CM ICD-9-CM   1. PTSD (post-traumatic stress disorder) F43.10 309.81   2. Depression F32.9 311   3. Anxiety F41.9 300.00       Interventions:  Cognitive Processing Therapy, Dialectical Behavioral Therapy (DBT), Problem Solving Therapy skills, Provided Psychoeducation, Supportive Psychotherapy    Current Treatment Plan   Created/Updated On 03/26/2016   Next Treatment Plan Due 06/25/2016         Plan:  Psychotherapy continues as described in care plan; plan remains the same.    NEXT APPT: 2 weeks      Chaney MallingLORRIE A Arelly Whittenberg, LMSW

## 2016-06-23 ENCOUNTER — Emergency Department (HOSPITAL_COMMUNITY)
Admission: EM | Admit: 2016-06-23 | Discharge: 2016-06-23 | Disposition: A | Discharge status: Home / Self Care | Payer: Managed Care, Other (non HMO) | Attending: Emergency Medicine | Admitting: Emergency Medicine

## 2016-06-23 ENCOUNTER — Encounter (HOSPITAL_COMMUNITY): Payer: Self-pay | Admitting: Emergency Medicine

## 2016-06-23 DIAGNOSIS — J45909 Unspecified asthma, uncomplicated: Secondary | ICD-10-CM | POA: Insufficient documentation

## 2016-06-23 DIAGNOSIS — H578 Other specified disorders of eye and adnexa: Secondary | ICD-10-CM | POA: Diagnosis present

## 2016-06-23 DIAGNOSIS — I10 Essential (primary) hypertension: Secondary | ICD-10-CM | POA: Insufficient documentation

## 2016-06-23 DIAGNOSIS — Z91040 Latex allergy status: Secondary | ICD-10-CM | POA: Diagnosis not present

## 2016-06-23 DIAGNOSIS — H1032 Unspecified acute conjunctivitis, left eye: Secondary | ICD-10-CM | POA: Diagnosis not present

## 2016-06-23 DIAGNOSIS — Z79899 Other long term (current) drug therapy: Secondary | ICD-10-CM | POA: Insufficient documentation

## 2016-06-23 HISTORY — DX: Essential (primary) hypertension: I10

## 2016-06-23 HISTORY — DX: Systemic involvement of connective tissue, unspecified: M35.9

## 2016-06-23 MED ORDER — LEVOFLOXACIN 0.5 % OP SOLN: 2.0000 [drp] | OPHTHALMIC | 0 refills | Status: AC

## 2016-06-23 NOTE — ED Triage Notes (Addendum)
Pt states she noticed an open sore to L eye lid several days ago, patient states L eye pain has worsened since yesterday and eye has become swollen with drainage.

## 2016-06-23 NOTE — Discharge Instructions (Signed)
Please take levofloxacin 1 to 2 drops every 2 hours while awake for 2 days, then every 4-8 hours for 5 days. Use warm compress on both eyes.  Get help right away if: You have a fever and your symptoms suddenly get worse. You have very bad pain when you move your eye. Your face: Hurts. Is red. Is swollen. You have sudden loss of vision.

## 2016-06-23 NOTE — ED Notes (Signed)
Patient visual acuity done corrected(with glasses)

## 2016-06-23 NOTE — ED Notes (Signed)
Pt states she is normally 20/20 bilaterally, corrected.

## 2016-06-23 NOTE — ED Provider Notes (Signed)
[facility]-EMERGENCY DEPT Provider Note   CSN: [id number] Arrival date & time: 06/23/16  [id number]     History   Chief Complaint Chief Complaint  Patient presents with  . Eye Problem    L eye  . Eye Pain    HPI Kim Duncan is a 20 y.o. female patient complains of a three-day worsening of left eye pain, swelling, drainage. She states that her pain is achy and a 5/10. Patient states that she has not tried anything for pain. Patient states that she has had something similar to this when she was younger that was diagnosed as pink eye and she states is very similar to that presentation. Patient admits to associated purulent discharge, itching, redness, and mild blurry vision to the left eye which she states is due to the "gunk" in her eye. Patient reports excess "gunk" in the morning when she wakes up. She reports right eye is without pain, discharge, or any symptoms. Patient reports starting a new gym recently and thinks she may have acquired a bacterial infection in her eye from the gym. Patient has a history of using glasses being regularly seen by a pediatric ophthalmologist until last year. She states that she is planning on making an appointment soon with an new adult ophthalmologist for a new prescription for her vision. Patient denies chest pain, shortness of breath, fevers, chills, loss of vision, change in gait, focal neurological deficits, double vision, photophobia, recent contacts, foreign bodies, trauma, loss of vision.   HPI  Past Medical History:  Diagnosis Date  . Allergy   . Anxiety   . Asthma   . Connective tissue disease (HCC)   . Headache(784.0)   . History of migraine headaches   . History of scarlet fever   . Hypertension   . Plica of knee, right 10/28/2011  . 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 Vision abnormalities     Patient Active Problem List   Diagnosis Date Noted  . Well child check 12/30/2014  . Hordeolum external 09/26/2014  .  Hypermobility of joint 08/30/2014  . Essential (primary) hypertension 07/28/2014  . Joint laxity 07/28/2014  . Pectus excavatum 07/28/2014  . Fast heart beat 07/28/2014  . Neurocardiogenic syncope 07/28/2014  . Syncope 07/18/2014  . Body mass index, pediatric, 5th percentile to less than 85th percentile for age 79/12/2013  . Presence of subdermal contraceptive device 01/20/2013  . Depression with anxiety 10/26/2012  . Plica of knee, right 10/28/2011  . 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 Chronic headaches, mixed HA type -- tension, migraine 10/28/2011  . Latex allergy 10/28/2011  . Drug intolerance 10/28/2011  . Allergic rhinitis, seasonal 12/22/2010  . Asthma, exercise induced 08/22/2009    History reviewed. No pertinent surgical history.  OB History    No data available       Home Medications    Prior to Admission medications   Medication Sig Start Date End Date Taking? Authorizing Provider  ADDERALL XR 30 MG 24 hr capsule Take 30 mg by mouth every morning. 07/12/14   Historical Provider, MD  albuterol (PROVENTIL,VENTOLIN) 90 MCG/ACT inhaler Inhale 2 puffs into the lungs as needed. Reported on 12/28/2015    Historical Provider, MD  amoxicillin (AMOXIL) 500 MG capsule Take 1 capsule (500 mg total) by mouth 2 (two) times daily. 07/03/15   Gretchen ShortSpenser Beasley, NP  atenolol (TENORMIN) 25 MG tablet Take 50 mg by mouth 2 (two) times daily.     Historical Provider, MD  calcium carbonate (OS-CAL) 600 MG TABS Take 600 mg by mouth every morning. Reported on 12/28/2015    Historical Provider, MD  citalopram (CELEXA) 40 MG tablet Take 40 mg by mouth at bedtime. 06/09/14   Historical Provider, MD  fluticasone (FLONASE) 50 MCG/ACT nasal spray Place 1 spray into both nostrils 2 (two) times daily. 07/03/15   Gretchen Short, NP  hydrOXYzine (VISTARIL) 25 MG capsule Take 25 mg by mouth every 4 (four) hours as needed. 05/23/14   Historical Provider, MD  lamoTRIgine (LAMICTAL) 100 MG tablet Take 50 mg  by mouth 2 (two) times daily.     Historical Provider, MD  levofloxacin Charlean Sanfilippo) 0.5 % ophthalmic solution Apply 2 drops to eye every 4 (four) hours. 1-2 drops every 2 hours while awake for 2 days, then every 4-8 hours for 5 days. 06/23/16   Colvin Blatt Manuel [place], [name]  ofloxacin (OCUFLOX) 0.3 % ophthalmic solution Place 1 drop into the left eye 3 (three) times daily. 09/26/14   Estelle June, NP  valACYclovir (VALTREX) 500 MG tablet Take 500 mg by mouth 2 (two) times daily. Reported on 12/28/2015 05/21/14   Historical Provider, MD    Family History Family History  Problem Relation Age of Onset  . Hypertension Father   . Hypertension Paternal Grandmother     Social History Social History  Substance Use Topics  . Smoking status: Never Smoker  . Smokeless tobacco: Never Used  . Alcohol use No     Allergies   Crab [shellfish allergy]; Other; Rabbit protein; Pseudoephedrine; and Latex   Review of Systems Review of Systems  Constitutional: Negative for chills and fever.  Eyes: Positive for pain, discharge, redness and itching.  Respiratory: Negative for shortness of breath.   Cardiovascular: Negative for chest pain.  Gastrointestinal: Negative for abdominal pain.  Skin: Negative for rash and wound.     Physical Exam Updated Vital Signs BP 139/90 (BP Location: Right Arm)   Pulse 89   Temp 99 F (37.2 C) (Oral)   Resp 16   Ht 5\' 6"  (1.676 m)   Wt 77.1 kg   SpO2 98%   BMI 27.44 kg/m   Physical Exam  Constitutional: She is oriented to person, place, and time. She appears well-developed and well-nourished. No distress.  HENT:  Head: Normocephalic and atraumatic.  Nose: Nose normal.  Mouth/Throat: Oropharynx is clear and moist.  Eyes: EOM and lids are normal. Pupils are equal, round, and reactive to light. Lids are everted and swept, no foreign bodies found. Left eye exhibits discharge. Left eye exhibits no exudate. No foreign body present in the left eye. Left conjunctiva is  injected.  Neck: Normal range of motion. Neck supple.  Cardiovascular: Normal rate and regular rhythm.   Pulmonary/Chest: Effort normal and breath sounds normal.  Neurological: She is alert and oriented to person, place, and time.  Psychiatric: She has a normal mood and affect. Her behavior is normal.  Nursing note and vitals reviewed.    ED Treatments / Results  Labs (all labs ordered are listed, but only abnormal results are displayed) Labs Reviewed - No data to display  EKG  EKG Interpretation None       Radiology No results found.  Procedures Procedures (including critical care time)  Medications Ordered in ED Medications - No data to display   Initial Impression / Assessment and Plan / ED Course  I have reviewed the triage vital signs and the nursing notes.  Pertinent labs & imaging results  that were available during my care of the patient were reviewed by me and considered in my medical decision making (see chart for details).  Clinical Course   Patient is a 20 year old female presenting with left eye pain, discharge, redness, itching for the last 3 days. On exam vital signs stable, afebrile, in no apparent distress. Heart and lungs are clear. Visual acuity 20/20 on the right eye, 20/50 on the left eye. This may be due to excess discharge and tear production on left eye. Left eye PERRL, EOM normal. Left eye conjunctiva injected. Left eye TTP and dry discharge noted. Right eye Normal. Patient has been followed for many years with a pediatric ophthalmologist. Patient given strict instructions to follow-up with ophthalmologist for follow-up for new prescription glasses that she was already planning on doing. No foreign bodies visualized in left eye.  Likely bacterial conjunctivitis. Low suspicion for corneal abrasion, corneal ulcer, or glaucoma this time. Patient is a contact lens wearer and will be treated accordingly with antibiotics. Patient agreeable with assessment and  plan. Return precautions given for any new or worsening symptoms such as worsening symptoms, worsening pain, sudden loss of vision.   Final Clinical Impressions(s) / ED Diagnoses   Final diagnoses:  Acute conjunctivitis of left eye, unspecified acute conjunctivitis type    New Prescriptions Discharge Medication List as of 06/23/2016 10:08 AM    START taking these medications   Details  levofloxacin (QUIXIN) 0.5 % ophthalmic solution Apply 2 drops to eye every 4 (four) hours. 1-2 drops every 2 hours while awake for 2 days, then every 4-8 hours for 5 days., Starting Sun 06/23/2016, Print         [address] [employer] [place], [place] 06/23/16 1039    Raeford RazorStephen Kohut, MD 06/27/16 1329

## 2017-07-04 NOTE — Telephone Encounter (Signed)
A user error has taken place: encounter opened in error, closed for administrative reasons.

## 2017-12-20 ENCOUNTER — Ambulatory Visit
Admission: AD | Admit: 2017-12-20 | Discharge: 2017-12-20 | Disposition: A | Discharge status: Home / Self Care | Payer: Commercial Managed Care - PPO | Source: Ambulatory Visit | Attending: Emergency Medicine | Admitting: Emergency Medicine

## 2017-12-20 DIAGNOSIS — J029 Acute pharyngitis, unspecified: Secondary | ICD-10-CM

## 2017-12-20 LAB — POCT AMBULATORY RAPID STREP
Lot #: 191143
Rapid Strep Group A Throat-POC: NEGATIVE

## 2017-12-20 MED ORDER — AMOXICILLIN 500 MG PO CAPS *I*
500.0000 mg | ORAL_CAPSULE | Freq: Two times a day (BID) | ORAL | 0 refills | Status: AC
Start: 2017-12-20 — End: 2017-12-30

## 2017-12-20 MED ORDER — ONDANSETRON 4 MG PO TBDP *I*
4.0000 mg | ORAL_TABLET | Freq: Three times a day (TID) | ORAL | 0 refills | Status: DC | PRN
Start: 2017-12-20 — End: 2018-06-03

## 2017-12-20 NOTE — Discharge Instructions (Signed)
Take amoxicillin and Zofran as prescribed  Push fluids  Continue Tylenol and Motrin  Go to the ED for worsening symptoms

## 2017-12-20 NOTE — ED Triage Notes (Signed)
Day 4 of N/V, fevers, sore thrat and congestion.       Triage Note   Rachel Deedarrie Jahan Friedlander, RN

## 2017-12-20 NOTE — UC Provider Note (Signed)
History     Chief Complaint   Patient presents with    Sore Throat     Day 4 of N/V, fevers, sore thrat and congestion.     Patient is a 22 year old female who presents with 4 days of nausea vomiting fevers sore throat.  She feels congested in her throat.  No difficulty swallowing or breathing.  There is pain in the throat does not radiate.  Gets worse with swallowing.  Taking Tylenol and Motrin for the fevers which does help.  Does feel nauseous constantly.  Vomiting on and off but is getting some food and drinks down.  Denies rash.  No cough.        History provided by:  Patient  Language interpreter used: No        Medical/Surgical/Family History     Past Medical History:   Diagnosis Date    Asthma     Ehlers-Danlos syndrome     Hypertension     POTS (postural orthostatic tachycardia syndrome)     Vasovagal syncope         Patient Active Problem List   Diagnosis Code    Sprain of left ankle, unspecified ligament, initial encounter S93.402A    Major depress dis, severe F32.2            History reviewed. No pertinent surgical history.  History reviewed. No pertinent family history.       Social History   Substance Use Topics    Smoking status: Never Smoker    Smokeless tobacco: Not on file      Comment: Pt denies     Alcohol use Yes      Comment: Pt states she ahs about a 0.5 bottle of wiskey     Living Situation     Questions Responses    Patient lives with Alone    Homeless No    Caregiver for other family member No    Pharmacologist    Employment Student    Comment: [employer]     Domestic Violence Risk No                Review of Systems   Review of Systems   Constitutional: Positive for appetite change, fatigue and fever. Negative for chills and diaphoresis.   HENT: Positive for sore throat. Negative for congestion, ear pain, postnasal drip, rhinorrhea, sinus pain, trouble swallowing and voice change.    Eyes: Negative for pain and discharge.   Respiratory: Negative for cough  and shortness of breath.    Cardiovascular: Negative for chest pain.   Gastrointestinal: Positive for nausea and vomiting. Negative for abdominal pain and diarrhea.   Genitourinary: Negative for decreased urine volume and difficulty urinating.   Musculoskeletal: Negative for arthralgias and myalgias.   Skin: Negative for rash.   Neurological: Negative for dizziness, weakness and headaches.       Physical Exam   Triage Vitals  Triage Start: Start, (12/20/17 1228)   First Recorded BP: 132/82, Resp: 18, Temp: 37.4 C (99.3 F), Temp src: TEMPORAL Oxygen Therapy SpO2: 98 %, Oximetry Source: Lt Hand, O2 Device: None (Room air),   Heart Rate (via Pulse Ox): (!) 114, (12/20/17 1230).      Physical Exam   Constitutional: She is oriented to person, place, and time. She appears well-developed and well-nourished. She is easily aroused.  Non-toxic appearance. She does not have a sickly appearance. She appears ill. No distress.   HENT:  Head: Normocephalic. Head is without right periorbital erythema and without left periorbital erythema.   Right Ear: Hearing, external ear and ear canal normal. Tympanic membrane is not erythematous. A middle ear effusion is present.   Left Ear: Hearing, external ear and ear canal normal. Tympanic membrane is not erythematous. A middle ear effusion is present.   Nose: Nose normal. No mucosal edema or rhinorrhea. Right sinus exhibits no maxillary sinus tenderness and no frontal sinus tenderness. Left sinus exhibits no maxillary sinus tenderness and no frontal sinus tenderness.   Mouth/Throat: Uvula is midline and mucous membranes are normal. No trismus in the jaw. No uvula swelling. Posterior oropharyngeal erythema present. Tonsils are 1+ on the right. Tonsils are 1+ on the left. Tonsillar exudate.   Handling secretions   Eyes: Pupils are equal, round, and reactive to light. Conjunctivae and EOM are normal.   Neck: Normal range of motion.   Cardiovascular: Regular rhythm, normal heart sounds and  normal pulses.  Tachycardia present.    Pulmonary/Chest: Effort normal and breath sounds normal.   Lymphadenopathy:     She has cervical adenopathy.        Right cervical: Superficial cervical adenopathy present. No deep cervical and no posterior cervical adenopathy present.       Left cervical: Superficial cervical adenopathy present. No deep cervical and no posterior cervical adenopathy present.   tender   Neurological: She is alert, oriented to person, place, and time and easily aroused.   Skin: Skin is warm, dry and intact. Capillary refill takes less than 2 seconds. No rash noted. She is not diaphoretic. No pallor.   Nursing note and vitals reviewed.       Medical Decision Making      Amount and/or Complexity of Data Reviewed  Clinical lab tests: ordered and reviewed  Review and summarize past medical records: yes        Initial Evaluation:  ED First Provider Contact     Date/Time Event User Comments    12/20/17 1229 ED First Provider Contact Henrietta Hoover Initial Face to Face Provider Contact          Patient was seen on: 12/20/2017        Assessment:  22 y.o.female comes to the Urgent Care Center with sore throat    Differential Diagnosis includes:  Acute Viral Pharyngitis  Acute Allergic Pharyngitis  Acute Bacterial Pharyngitis  Acute Streptococcal Pharyngitis  Acute URI NOS      Plan: Patient is a 22 year old female presents nausea vomiting fever and sore throat.  Appears ill but not significantly distress.  Based on signs and symptoms will treat with amoxicillin and Zofran.     I have reviewed all labs and discharge instructions with the patient. I have answered all questions to the best of my knowledge. Patient/caregiver verbalizes understanding and is agreeable to discharge.    Dragon Chemical engineer was used for part/all of this encounter. Errors in grammar were changed and fixed to the best of my ability.     Take amoxicillin and Zofran as prescribed  Push fluids  Continue Tylenol and  Motrin  Go to the ED for worsening symptoms    Orders Placed This Encounter    POCT rapid strep    amoxicillin (AMOXIL) 500 MG capsule    ondansetron (ZOFRAN-ODT) 4 MG disintegrating tablet       No results found for this or any previous visit (from the past 24 hour(s)).  Final Diagnosis    ICD-10-CM ICD-9-CM   1. Pharyngitis, unspecified etiology J02.9 462       Encourage fluids, encourage rest, good hand hygiene.    Use over the counter medications as discussed.    Please start the new medications as below:    Current Discharge Medication List      New Medications    Details Last Dose Given Next Dose Due Script Given?   amoxicillin (AMOXIL) 500 mg Dose: 500 mg  Take 500 mg by mouth 2 times daily  Quantity 20 capsule, Refill 0  Start date: 12/20/2017, End date: 12/30/2017            ondansetron (ZOFRAN-ODT) 4 mg Dose: 4 mg  Take 4 mg by mouth 3 times daily as needed for Nausea   Place on top of tongue.  Quantity 10 tablet, Refill 0  Start date: 12/20/2017       Comments: Emergency Encounter                   Please follow up with your physician as below:    Follow-up Information     Provider, None.    Why:  If symptoms worsen  Contact information:  [address]  [address]  [place] [id number]                 Thank you Herby AbrahamBrittany Losurdo for coming to [facility] for your health care concerns.    If your condition changes and/or worsens please follow up with her primary doctor and/or return to the urgent care center.    If short of breath, chest pains or any other concerns please report to the emergency room.    In the event of an Emergency dial 911.               Final Diagnosis  Final diagnoses:   [J02.9] Pharyngitis, unspecified etiology (Primary)         Maudry MayhewGarrett Silo Kambry Takacs, PA              Henrietta HooverCarlson, Franciso Dierks [place], [place]  12/20/17 1242

## 2017-12-20 NOTE — ED Notes (Signed)
Patient discharged home in stable condition.  Discharge instructions reviewed.  Patient verbalizes understanding.   Questions invited and answered.  Belongings accounted for and taken with patient.

## 2018-05-08 ENCOUNTER — Emergency Department
Admission: EM | Admit: 2018-05-08 | Discharge: 2018-05-09 | Disposition: A | Discharge status: Home / Self Care | Payer: 59 | Source: Ambulatory Visit | Attending: Emergency Medicine | Admitting: Emergency Medicine

## 2018-05-08 DIAGNOSIS — R000 Tachycardia, unspecified: Secondary | ICD-10-CM

## 2018-05-08 DIAGNOSIS — R0789 Other chest pain: Secondary | ICD-10-CM

## 2018-05-08 DIAGNOSIS — L509 Urticaria, unspecified: Secondary | ICD-10-CM | POA: Insufficient documentation

## 2018-05-08 DIAGNOSIS — R21 Rash and other nonspecific skin eruption: Secondary | ICD-10-CM

## 2018-05-08 NOTE — ED Notes (Signed)
Pt presents for allergic reaction.  Endorsing dyspnea at baseline.  Pt presents with diffuse rash beginning at the neck and spreading to the chest.  Pt denies change in food, laundry detergent.  Denies chemical exposure.  Hx EDS.  Denies CP.  Endorsing nausea.  A&O x4, ambulatory.

## 2018-05-08 NOTE — ED Triage Notes (Signed)
Patient feels like she is having an allergic reaction.  Redness and itching to neck.  Self reports mild trouble breathing.  Took 5mg  benadryl 20 min ago.        Triage Note   Humphrey RollsKelsie Anderson, RN

## 2018-05-09 ENCOUNTER — Encounter: Payer: Self-pay | Admitting: Emergency Medicine

## 2018-05-09 LAB — HOLD BLUE

## 2018-05-09 LAB — BLOOD BANK HOLD RED

## 2018-05-09 LAB — HOLD SST

## 2018-05-09 LAB — BLOOD BANK HOLD LAVENDER

## 2018-05-09 MED ORDER — ONDANSETRON HCL 2 MG/ML IV SOLN *I*
4.0000 mg | Freq: Once | INTRAMUSCULAR | Status: AC
Start: 2018-05-09 — End: 2018-05-09
  Administered 2018-05-09: 4 mg via INTRAVENOUS
  Filled 2018-05-09: qty 2

## 2018-05-09 MED ORDER — DEXTROSE 5 % FLUSH FOR PUMPS *I*
0.0000 mL/h | INTRAVENOUS | Status: DC | PRN
Start: 2018-05-09 — End: 2018-05-09

## 2018-05-09 MED ORDER — ALBUTEROL SULFATE (2.5 MG/3ML) 0.083% IN NEBU *I*
2.5000 mg | INHALATION_SOLUTION | Freq: Once | RESPIRATORY_TRACT | Status: AC
Start: 2018-05-09 — End: 2018-05-09
  Administered 2018-05-09: 2.5 mg via RESPIRATORY_TRACT
  Filled 2018-05-09: qty 3

## 2018-05-09 MED ORDER — FAMOTIDINE 40 MG PO TABS *I*
20.0000 mg | ORAL_TABLET | Freq: Every evening | ORAL | 0 refills | Status: DC
Start: 2018-05-09 — End: 2018-06-03

## 2018-05-09 MED ORDER — PREDNISONE 20 MG PO TABS *I*
60.0000 mg | ORAL_TABLET | Freq: Once | ORAL | Status: AC
Start: 2018-05-09 — End: 2018-05-09
  Administered 2018-05-09: 60 mg via ORAL
  Filled 2018-05-09: qty 3

## 2018-05-09 MED ORDER — FAMOTIDINE (PF) 20 MG/2ML IV SOLN *I*
20.0000 mg | Freq: Once | INTRAVENOUS | Status: AC
Start: 2018-05-09 — End: 2018-05-09
  Administered 2018-05-09: 20 mg via INTRAVENOUS
  Filled 2018-05-09: qty 2

## 2018-05-09 MED ORDER — LACTATED RINGERS IV BOLUS *I*
1000.0000 mL | Freq: Once | INTRAVENOUS | Status: AC
Start: 2018-05-09 — End: 2018-05-09
  Administered 2018-05-09: 1000 mL via INTRAVENOUS

## 2018-05-09 MED ORDER — HYDROXYZINE HCL 25 MG PO TABS *I*
50.0000 mg | ORAL_TABLET | Freq: Once | ORAL | Status: AC
Start: 2018-05-09 — End: 2018-05-09
  Administered 2018-05-09: 50 mg via ORAL
  Filled 2018-05-09: qty 2

## 2018-05-09 MED ORDER — CETIRIZINE HCL 5 MG PO TABS *I*
5.0000 mg | ORAL_TABLET | Freq: Every day | ORAL | 0 refills | Status: AC
Start: 2018-05-09 — End: 2018-05-13

## 2018-05-09 MED ORDER — PREDNISONE 20 MG PO TABS *I*
40.0000 mg | ORAL_TABLET | Freq: Every day | ORAL | 0 refills | Status: AC
Start: 2018-05-09 — End: 2018-05-13

## 2018-05-09 MED ORDER — SODIUM CHLORIDE 0.9 % FLUSH FOR PUMPS *I*
0.0000 mL/h | INTRAVENOUS | Status: DC | PRN
Start: 2018-05-09 — End: 2018-05-09

## 2018-05-09 NOTE — ED Provider Notes (Addendum)
History     Chief Complaint   Patient presents with    Allergic Reaction     22 y.o. female with significant medical history of Ehlers-Danlos syndrome, POTS, and vasovagal syncope presenting with sudden onset of rash, airway irritation, and nausea. These were unprovoked; she had eaten hummus and crackers several hours before but had eaten these foods many times before without issue. She noticed an erythematous, urticarial chest rash beginning ~16min prior to presentation with simultaneous mild dyspnea, throat scratchiness, and nausea without emesis. She has no other symptoms. She is tolerating her secretions well and breathing comfortably. Her skin feels very itchy. She had taken diphenhydramine shortly before presenting but has seen no change in the rate of the rash spreading.          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  No family history on file.       Social History     Tobacco Use    Smoking status: Never Smoker    Tobacco comment: Pt denies    Substance Use Topics    Alcohol use: Yes     Comment: Pt states she ahs about a 0.5 bottle of wiskey    Drug use: Yes     Types: Marijuana     Comment: Pt states she uses "a couple time s a week" last use was 2 days ago     Living Situation     Questions Responses    Patient lives with Alone    Homeless No    Caregiver for other family member No    Pharmacologist    Employment Student    Comment: [employer]     Domestic Violence Risk No                Review of Systems   Review of Systems   Constitutional: Negative for fever.   HENT: Negative for congestion.    Eyes:  Negative for pain.   Respiratory: Positive for chest tightness. Negative for cough.    Cardiovascular: Negative for chest pain.   Gastrointestinal: Negative for abdominal pain.   Genitourinary: Negative for flank pain.   Musculoskeletal: Negative for back pain.   Skin: Positive for rash.   Neurological: Negative for headaches.   Psychiatric/Behavioral: Negative for confusion.       Physical Exam     Triage Vitals  Triage Start: Start, (05/08/18 2354)   First Recorded BP: (!) 169/97, Resp: (!) 28, Temp: 36.3 C (97.3 F), Temp src: TEMPORAL Oxygen Therapy SpO2: 100 %, Oximetry Source: Rt Hand, O2 Device: None (Room air), Heart Rate: (!) 128, (05/08/18 2354) Heart Rate (via Pulse Ox): (!) 127, (05/08/18 2354).      Physical Exam  Vitals signs and nursing note reviewed.   Constitutional:       General: She is not in acute distress.     Appearance: She is well-developed. She is not diaphoretic.   HENT:  Head: Normocephalic and atraumatic.      Comments: No tongue swelling or other intraoral or lip swelling.     Right Ear: External ear normal.      Left Ear: External ear normal.   Eyes:      General: No scleral icterus.     Conjunctiva/sclera: Conjunctivae normal.   Neck:      Musculoskeletal: Normal range of motion and neck supple.   Cardiovascular:      Rate and Rhythm: Regular rhythm. Tachycardia present.      Heart sounds: Normal heart sounds. No murmur. No friction rub. No gallop.    Pulmonary:      Effort: Pulmonary effort is normal. No respiratory distress.      Breath sounds: Normal breath sounds. No wheezing or rales.      Comments: Breathing comfortably.  Chest:      Chest wall: No tenderness.   Abdominal:      General: Bowel sounds are normal. There is no distension.      Palpations: Abdomen is soft. There is no mass.      Tenderness: There is no tenderness. There is no guarding or rebound.   Musculoskeletal: Normal range of motion.   Skin:     General: Skin is warm and dry.      Comments: Rash on anterior  chest, gradually spreading to the shoulders, upper abdomen, and neck, erythematous and urticarial.   Neurological:      Mental Status: She is alert and oriented to person, place, and time.   Psychiatric:         Behavior: Behavior normal.         Thought Content: Thought content normal.         Judgment: Judgment normal.         Medical Decision Making   Patient seen by me on:  05/08/2018    Assessment:  Stable. Appears to be having idiopathic allergic reaction progressing to anaphylaxis. No clear trigger. Her history of connective tissue disease increases the possibility of autoimmune or other idiopathic reaction over allergen-induced anaphylaxis but both are plausible. She fortunately does not currently require epinephrine but does require monitoring.    Plan:  Pulse oximetry, diphenhydramine, albuterol, famotidine, hydroxyzine, ondansetron    ED Course and Disposition:  Feels much better. Rash improving. Airway scratchiness resolved. No further nausea. Desires to go home. Counseled, encouraged follow up, discharged.            Dena BilletKyle Barbour, MD    Resident Attestation:    Patient seen by me on 05/08/2018.    History:  I reviewed this patient, reviewed the resident's note and agree.    Exam:  I examined this patient, reviewed the resident's note and agree.    Decision Making:  I discussed with the resident his/her documented decision making and agree.      Author:  Cora DanielsKayla Jeanne Advik Weatherspoon, MD       Dena BilletBarbour, Kyle, MD  Resident  05/11/18 [phone]       Cora Danielsewey, Breylen Agyeman Jeanne, MD  05/11/18 [phone]

## 2018-05-09 NOTE — Discharge Instructions (Signed)
You were seen in the emergency department for hives. Fortunately, you improved quickly and are able to go home. We are not sure why you had this reaction but it approached an anaphylactic reaction.    Please do the following:  - keep an eye on your symptoms and try to identify possible triggers if it happens again  - take the prescribed medications  - follow up with primary care (we have referred you and you will get a call, there is also a list below of doctors accepting new patients)    If you have trouble breathing, throat swelling or scratchiness, or your symptoms otherwise worsen, please come back to the emergency department. Otherwise, please follow up with your regular primary care doctor.    The following primary care physicians are  currently accepting new patients:    [facility]  [facility]  [address]  [place], [place] [id number]  [phone]  Eustace MooreLauri Carrier, MD (Peds)  Karle PlumberJulie Lenhard, MD (Peds)  Kayleen MemosPamela Stone, MD (Peds)    [facility]   [address]            [place], [place] [address]                               [phone]                                      Neita Goodnightaysha Crawford, DO ([place])   [place]  [facility]  [address]  [place], [id number]  [phone]  Please call for a list of physicians accepting new patients     [facility]  [address]  [address]  [place], [id number]  [phone]  Delma OfficerKristen Walker, MD (IM)    [facility] [place]  [address]   [place], [place]  [phone]  Anne HahnLishan Walker, MD (FM) [facility]   [address] [place], [place] [id number]   [phone]   Jerlyn Lyheryl Kame, MD (Peds)   Fernanda DrumStephanie Page, MD (Peds)   Carrington ClampMax Steiner, MD (Peds)     [facility]  [facility]  [facility]  [address]   [place], [place] [address]   [phone]  Anne FuAna Borge-Janania, MD  (FM)  Colbert EwingBrett Kemp, MD (FM)  Truddie CrumbleMichael Mungillo, MD (FM)  Lenard SimmerLily J. Snyder, MD (FM)    [facility]  [address]  [place], [place] [id number]  [phone]  A. Craige CottaBelen Judkins, MD (FM)    [facility]  [facility] [place]   [address]   [place], [place] [id number]  [phone]   [name] LopesPrity Rawal, MD (FM)    [facility]   [address]  [place], [place] [address]  [phone]  Janeal Holmesrew Emerson, MD (FM)     [facility]   [place]  [address]  [place], [place] [address]  [phone]  Josefine ClassScott Wilson, MD (FM)    [facility]  [facility]  [facility]  [address] [place]  [place], [place] [id number]  [phone]  Merton BorderJoanne Nazareth, MD (IM)  [place] [facility]  [address], [place] [address]  [phone]  Baruch Goldmann, MD (IM)         FM: Family Medicine   IM: Internal Medicine  IM/Peds: Internal Medicine & Pediatrics  MP: Family Medicine & Pediatrics  OB Obstetrics  Peds: Pediatrics   For physician referral services, please call [phone] visit [email or web address]     Updated 04/27/2018

## 2018-05-09 NOTE — ED Notes (Signed)
Pt ambulatory without assistance.  Tolerating PO intake at this time.  Pt discharge instructions reviewed, pt expresses understanding regarding plan of care and discharge.  Pt advised to follow up with PCP.  IV removed, catheter tip intact, tolerated well.  Pt has belongings and is safe to discharge.

## 2018-06-03 ENCOUNTER — Ambulatory Visit
Admission: AD | Admit: 2018-06-03 | Discharge: 2018-06-03 | Disposition: A | Discharge status: Home / Self Care | Payer: 59 | Source: Ambulatory Visit | Attending: Emergency Medicine | Admitting: Emergency Medicine

## 2018-06-03 ENCOUNTER — Encounter: Payer: Self-pay | Admitting: Emergency Medicine

## 2018-06-03 DIAGNOSIS — J020 Streptococcal pharyngitis: Secondary | ICD-10-CM | POA: Insufficient documentation

## 2018-06-03 LAB — POCT AMBULATORY RAPID STREP
Exp date: 10
Lot #: 191193
Rapid Strep Group A Throat-POC: NEGATIVE

## 2018-06-03 LAB — POCT INFLUENZA
Exp date: 8
Influenza A, POCT: NEGATIVE
Influenza B, POCT: NEGATIVE
Lot #: 705418

## 2018-06-03 MED ORDER — LIDOCAINE VISCOUS 2 % MT SOLN *I*
5.0000 mL | OROMUCOSAL | 0 refills | Status: DC | PRN
Start: 2018-06-03 — End: 2019-12-28

## 2018-06-03 MED ORDER — AMOXICILLIN 500 MG PO CAPS *I*
500.0000 mg | ORAL_CAPSULE | Freq: Two times a day (BID) | ORAL | 0 refills | Status: AC
Start: 2018-06-03 — End: 2018-06-13

## 2018-06-03 NOTE — Discharge Instructions (Signed)
You were seen at urgent care today for fevers and sore throat.  You were diagnosed with strep throat and you will be treated with antibiotics.    WARM SALT WATER GARGLES  CONTINUE IBUPROFEN 400-600 MG EVERY 6-8 HOURS FOR PAIN/FEVER  DRINK PLENTY OF FLUIDS  TAKE THE ENTIRE DOSE OF ANTIBIOTICS AS PRESCRIBED, EVEN IF YOU BEGIN TO FEEL BETTER    Please remember that you're contagious for 24 hours after first dose of antibiotic administration. For these reasons we ask that you:  Avoid close contact with the young or elderly  Avoid coughing/kissing around other people        -     Throw out your old toothbrush or any other oral utensils you use regularly    Please return to urgent care or the emergency department if you develop worsening fevers, shortness of breath, wheezing, inability to tolerate oral secretions, persistent nausea/vomiting or inability to tolerate fluids by mouth.

## 2018-06-03 NOTE — ED Triage Notes (Signed)
Sore throat, fever and neck swelling x 2 days  Triage Note   Rachel Jimenez R [name], RN

## 2018-06-03 NOTE — UC Provider Note (Signed)
History     Chief Complaint   Patient presents with    Sore Throat     Sore throat, fever and neck swelling x 2 days     Patient is a 22 year old female with past medical history of asthma, hypertension and postural orthostatic tachycardia syndrome who presents with fever and sore throat for the past several days.  Patient reports approximately 3 or 4 days ago she felt generally lethargic, and reports last night she had significant fever with sore throat.  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  Patient reports intermittent use of Excedrin for her symptoms with mild improvement, and reports that she has not taken anything since this morning.  Patient reports this morning she looked in her mouth and noticed white spots on her right tonsil, and reports that her symptoms are similar to when she had strep throat this past summer.  Patient reports that she is able to eat and drink, however this does exacerbate her pain.  Patient also reports mild sinus congestion with bilateral ear pressure and a dry cough.  She denies fever/chills, changes in hearing, bleeding/discharge from the ears, neck rigidity, difficulty managing oral secretions, unilateral neck swelling, wheezing, shortness of breath, abdominal pain or nausea/vomiting.          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       Social History     Tobacco Use    Smoking status: Never Smoker    Smokeless tobacco: Never Used    Tobacco  comment: Pt denies    Substance Use Topics    Alcohol use: Yes     Comment: Pt states she ahs about a 0.5 bottle of wiskey    Drug use: Yes     Types: Marijuana     Comment: Pt states she uses "a couple time s a week" last use was 2 days ago     Living Situation     Questions Responses    Patient lives with Alone    Homeless No    Caregiver for other family member No    Pharmacologist    Employment Student    Comment: [employer]     Domestic Violence Risk No                Review of Systems   Review of Systems   Constitutional: Positive for fever. Negative for appetite change, chills, diaphoresis and unexpected weight change.   HENT: Positive for congestion, postnasal drip and sore throat. Negative for dental problem, ear discharge, ear pain, rhinorrhea, sinus pressure, sinus pain and trouble swallowing.    Eyes: Negative.    Respiratory: Positive for cough (dry). Negative for chest tightness, shortness of breath and wheezing.    Cardiovascular: Negative.  Negative for chest  pain.   Gastrointestinal: Negative for abdominal pain, diarrhea, nausea and vomiting.   Endocrine: Negative.    Genitourinary: Negative.  Negative for dysuria, flank pain, frequency and urgency.   Musculoskeletal: Negative for arthralgias and myalgias.   Allergic/Immunologic: Negative.    Neurological: Negative.  Negative for dizziness, light-headedness and headaches.   Hematological: Negative.    Psychiatric/Behavioral: Negative.        Physical Exam   Triage Vitals  Triage Start: Start, (06/03/18 1914)   First Recorded BP: 117/69, Resp: 20, Temp: 37.3 C (99.1 F) Oxygen Therapy SpO2: 96 %, Heart Rate: (!) 125, (06/03/18 1921)  .      Physical Exam  Vitals signs and nursing note reviewed.   Constitutional:       General: She is not in acute distress.     Appearance: She is well-developed. She is not ill-appearing, toxic-appearing or diaphoretic.   HENT:      Head:      Jaw: No trismus.      Right Ear: Tympanic membrane is  injected. Tympanic membrane is not erythematous or bulging.      Left Ear: Tympanic membrane is injected. Tympanic membrane is not erythematous or bulging.      Nose: Mucosal edema and rhinorrhea present.      Right Sinus: No maxillary sinus tenderness or frontal sinus tenderness.      Left Sinus: No maxillary sinus tenderness or frontal sinus tenderness.      Mouth/Throat:      Pharynx: Uvula midline. Posterior oropharyngeal erythema present. No oropharyngeal exudate.      Tonsils: Tonsillar exudate (right) present. Swelling: 1+ on the right. 1+ on the left.   Eyes:      Conjunctiva/sclera: Conjunctivae normal.   Neck:      Musculoskeletal: Normal range of motion.   Cardiovascular:      Rate and Rhythm: Normal rate and regular rhythm.      Pulses:           Radial pulses are 2+ on the right side and 2+ on the left side.      Heart sounds: Normal heart sounds, S1 normal and S2 normal. No murmur.   Pulmonary:      Effort: Pulmonary effort is normal. No respiratory distress.      Breath sounds: Normal breath sounds. No decreased breath sounds, wheezing, rhonchi or rales.   Abdominal:      General: Bowel sounds are normal. There is no distension.      Palpations: Abdomen is soft. Abdomen is not rigid.      Tenderness: There is no tenderness. There is no guarding. Negative signs include Murphy's sign and McBurney's sign.   Musculoskeletal: Normal range of motion.   Lymphadenopathy:      Cervical: Cervical adenopathy present.   Skin:     General: Skin is warm and dry.      Capillary Refill: Capillary refill takes less than 2 seconds.   Neurological:      Mental Status: She is alert and oriented to person, place, and time.   Psychiatric:         Behavior: Behavior normal. Behavior is cooperative.         Thought Content: Thought content normal.          Medical Decision Making      Amount and/or Complexity of Data Reviewed  Clinical lab tests: ordered and reviewed        Initial Evaluation:  ED First Provider  Contact      Date/Time Event User Comments    06/03/18 1909 ED First Provider Contact Gia Lusher Initial Face to Face Provider Contact          Patient was seen on: 06/03/2018    Assessment:  22 y.o.female comes to the Urgent Care Center with fever and URI symptoms for the past several days.  Vital signs reviewed and stable, patient tolerating oral secretions and in no acute respiratory distress.    Differential Diagnosis includes:  Strep pharyngitis  Influenza  Viral bronchitis  URI  Nasopharyngitis  Nasosinusitis   Postnasal Drip  CAP less likely as patient afebrile with lung sounds clear to auscultation bilaterally  PTA less likely as patient afebrile with no presence of lesions within the oropharynx    Plan:   Orders Placed This Encounter    POCT rapid strep    POCT Influenza    amoxicillin (AMOXIL) 500 MG capsule    lidocaine (XYLOCAINE) 2 % solution       Recent Results (from the past 24 hour(s))   POCT rapid strep    Collection Time: 06/03/18  7:28 PM   Result Value Ref Range    Rapid Strep Group A Throat-POC Negative for Streptococcus Group A Antigen Negative    INTERNAL CONTROL RAPID STREP POCT *Yes-internal procedural control(s) acceptable     Exp date 10 20     Lot # [id number]    POCT Influenza    Collection Time: 06/03/18  7:45 PM   Result Value Ref Range    Influenza A, POCT Negative for Flu A Negative    Influenza B, POCT Negative for Flu B Negative    Internal Control Flu, POCT Yes- internal procedural control(s) acceptable     Exp date 08 12 21     Lot # [id number]      Patient afebrile and well-appearing on exam, she is in no acute respiratory distress and handling oral secretions.  Rapid flu is negative, rapid strep also negative, however there is high clinical suspicion for strep pharyngitis given presence of fever, sore throat, tonsillar exudate and cervical lymphadenopathy.  Patient encouraged supportive measures and advised to follow-up with her PCP.  She was also given return precautions for new  worsening symptoms.    Final Diagnosis    ICD-10-CM ICD-9-CM   1. Strep pharyngitis J02.0 034.0       Encourage fluids, encourage rest, good hand hygiene.    Use over the counter medications as discussed.    Please start the new medications as below:    Current Discharge Medication List      New Medications    Details Last Dose Given Next Dose Due Script Given?   amoxicillin (AMOXIL) 500 mg Dose: 500 mg  Take 500 mg by mouth 2 times daily  Quantity 20 capsule, Refill 0  Start date: 06/03/2018, End date: 06/13/2018            lidocaine (XYLOCAINE) 5 mLs Dose: 5 mLs  Swish and spit 5 mLs every 4 hours as needed for Pain  Quantity 100 mL, Refill 0  Start date: 06/03/2018                   Please follow up with your physician as below:    Follow-up Information     [facility]. Go in 3 days.    Specialty:  Emergency Medicine  Why:  If symptoms worsen  Contact information:  [address]  [place] [id number]  [phone]      Thank you Herby AbrahamBrittany Lofstrom for coming to [facility] for your health care concerns.    If your condition changes and/or worsens please follow up with her primary doctor and/or return to the urgent care center.    If short of breath, chest pains or any other concerns please report to the emergency room.    In the event of an Emergency dial 911.      Final Diagnosis  Final diagnoses:   [J02.0] Strep pharyngitis (Primary)         Wille Celesteimothy Leilynn Pilat, NP              Wille CelesteNervina, Aron Needles, NP  06/03/18 2002

## 2018-06-03 NOTE — ED Triage Notes (Signed)
Sore throat, fever and neck swelling x 2 days  Triage Note   Beverly Milchelia R Flornce Record, RN

## 2018-06-23 ENCOUNTER — Ambulatory Visit
Admission: AD | Admit: 2018-06-23 | Discharge: 2018-06-23 | Disposition: A | Discharge status: Home / Self Care | Payer: 59 | Source: Ambulatory Visit | Attending: Physician Assistant | Admitting: Physician Assistant

## 2018-06-23 DIAGNOSIS — L03031 Cellulitis of right toe: Secondary | ICD-10-CM | POA: Insufficient documentation

## 2018-06-23 MED ORDER — CEPHALEXIN 500 MG PO CAPS *I*
500.0000 mg | ORAL_CAPSULE | Freq: Four times a day (QID) | ORAL | 0 refills | Status: AC
Start: 2018-06-23 — End: 2018-06-30

## 2018-06-23 MED ORDER — SULFAMETHOXAZOLE-TRIMETHOPRIM 800-160 MG PO TABS *I*
1.0000 | ORAL_TABLET | Freq: Two times a day (BID) | ORAL | 0 refills | Status: AC
Start: 2018-06-23 — End: 2018-07-03

## 2018-06-23 NOTE — ED Triage Notes (Signed)
Patient has been picking her right great toenail, the area around her nail is draining, swelling and red.      Triage Note   Payton SparkJulie L Amadi Frady, RN

## 2018-06-23 NOTE — Discharge Instructions (Signed)
Please continue use of warm water soaks on affected area, with 1 teaspoon of Epsom salts added (found over-the-counter), to help slowly express the infection out from within the skin.  Soak your infected toe in warm water for 20-30 minutes, 3 times a day.  Take Antibiotics as directed and finish them.  Reduce activities and keep your foot elevated when able to reduce swelling and discomfort. Do this until the infection gets better.  Wear sandals or go barefoot as much as possible while the infected area is sensitive.    If your Rachel Jimenez/rash continues to become more red, swollen, tender, warm to the touch, or starts to show pus-like discharge, your right great toe or right foot starts to swell, you notice proximal red streaking, or you develop high fever/chills, you need to be transported immediately to the emergency room for further evaluation/treatment of worsening cellulitis.

## 2018-06-23 NOTE — UC Provider Note (Addendum)
History     Chief Complaint   Patient presents with    Cellulitis     Patient has been picking her right great toenail, the area around her nail is draining, swelling and red.       Patient is a 22 year old female presents with 1 month of worsening erythema, drainage, and tenderness along her right great toe.  Patient states she had an ingrown toenail that she ripped out of the lateral side right great toe 1 month ago, and has been nervous and picking at the area ever since.  She states that the area has become erythematous, swollen, and tender, with frequent drainage, and she's been trying to soak her toe as much as she can at home, but the symptoms persist.  She denies any streaking of erythema up her right foot, denies any fever/chills or any constitutional symptoms.  No pain with walking.  Patient is a nondiabetic.  No traumatic injury or fall.            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       Social History     Tobacco Use    Smoking status: Never Smoker    Smokeless tobacco: Never Used    Tobacco comment: Pt denies    Substance Use Topics    Alcohol use: Yes     Comment: Pt states she ahs about a 0.5 bottle of wiskey    Drug use: Yes     Types: Marijuana     Comment: Pt states she uses "a couple time s a week" last use was 2 days ago     Living Situation     Questions Responses    Patient lives with Alone    Homeless No    Caregiver for other family member No    [facility]    Employment Student    Comment: [employer]     Domestic  Violence Risk No                Review of Systems   Review of Systems   Constitutional: Negative for activity change, appetite change, chills, fatigue and fever.   Respiratory: Negative for shortness of breath.    Cardiovascular: Negative for chest pain.   Gastrointestinal: Negative for diarrhea, nausea and vomiting.   Genitourinary: Negative.    Musculoskeletal: Negative for arthralgias, joint swelling and myalgias.   Skin: Positive for color change and wound. Negative for pallor.   Allergic/Immunologic: Negative for environmental allergies, food allergies and immunocompromised state.   Neurological: Negative.    Hematological: Negative for adenopathy.       Physical Exam   Triage Vitals  Triage Start: Start, (06/23/18 1706)   First Recorded BP: (!) 145/97, Temp: 36.6 C (97.9 F), Temp src: TEMPORAL Oxygen Therapy SpO2: 98 %, Oximetry Source: Rt Hand, O2 Device:  None (Room air), Heart Rate: 86, (06/23/18 1705) Heart Rate (via Pulse Ox): 86, (06/23/18 1705).  First Pain Reported  0-10 Scale: 2, Pain Location/Orientation: Foot Right, (06/23/18 1708)       Physical Exam  Vitals signs and nursing note reviewed.   Constitutional:       General: She is not in acute distress.     Appearance: She is well-developed.   HENT:      Head: Normocephalic and atraumatic.   Neck:      Musculoskeletal: Normal range of motion and neck supple.   Cardiovascular:      Pulses: No decreased pulses.   Musculoskeletal:         General: Tenderness present. No deformity.      Right foot: Normal range of motion and normal capillary refill. Tenderness and swelling present. No bony tenderness, crepitus or laceration.        Feet:    Skin:     General: Skin is warm and dry.      Coloration: Skin is not pale.      Findings: Erythema present.   Neurological:      Mental Status: She is alert and oriented to person, place, and time.      Sensory: No sensory deficit.      Motor: No atrophy.      Deep Tendon Reflexes: Reflexes are normal and  symmetric.          Medical Decision Making        Initial Evaluation:  ED First Provider Contact     Date/Time Event User Comments    06/23/18 1711 ED First Provider Contact Essense Bousquet Initial Face to Face Provider Contact          Patient was seen on: 06/23/2018        Assessment:  22 y.o.female comes to the Urgent Care Center with 1 month of worsening lateral side right great toe paronychia with subsequent cellulitis after frequent picking at the area, from initial ingrown toenail.  Patient has been using soaks at home without significant relief of her symptoms.    Differential Diagnosis includes:  Paronychia  Abscess  Cellulitis  Toenail infection  Ingrown toenail      Plan:   Orders Placed This Encounter    Aerobic Culture    cephalexin (KEFLEX) 500 MG capsule    sulfamethoxazole-trimethoprim (BACTRIM DS,SEPTRA DS) 800-160 MG per tablet       No results found for this or any previous visit (from the past 24 hour(s)).        Final Diagnosis    ICD-10-CM ICD-9-CM   1. Cellulitis of great toe of right foot L03.031 681.10   2. Paronychia of great toe, right L03.031 681.11         Discharge Instructions       Please continue use of warm water soaks on affected area, with 1 teaspoon of Epsom salts added (found over-the-counter), to help slowly express the infection out from within the skin.  Soak your infected toe in warm water for 20-30 minutes, 3 times a day.  Take Antibiotics as directed and finish them.  Reduce activities and keep your foot elevated when able to reduce swelling and discomfort. Do this until the infection gets better.  Wear sandals or go barefoot as much as possible while the infected area is sensitive.    If your Rachel Jimenez continues to become more red, swollen, tender, warm to the touch, or starts to show pus-like  discharge, your right great toe or right foot starts to swell, you notice proximal red streaking, or you develop high fever/chills, you need to be transported immediately to the  emergency room for further evaluation/treatment of worsening cellulitis.          Encourage fluids, encourage rest, good hand hygiene.    Use over the counter medications as discussed.    Please start the new medications as below:    Current Discharge Medication List      New Medications    Details Last Dose Given Next Dose Due Script Given?   cephalexin (KEFLEX) 500 mg Dose: 500 mg  Take 500 mg by mouth 4 times daily  Quantity 28 capsule, Refill 0  Start date: 06/23/2018, End date: 06/30/2018            sulfamethoxazole-trimethoprim (BACTRIM DS,SEPTRA DS) 1 tablet Dose: 1 tablet  Take 1 tablet by mouth 2 times daily  Quantity 20 tablet, Refill 0  Start date: 06/23/2018, End date: 07/03/2018       Comments: Emergency Encounter                   Please follow up with your physician as below:    Follow-up Information     [facility] [place]. Go in 1 week.    Specialty:  Emergency Medicine  Why:  If symptoms worsen or persist/do not improve, For further evaluation   and treatment  Contact information:  [address]  [place] [id number]  [phone]               Thank you Rachel AbrahamBrittany Jimenez for choosing [facility] for your health concerns.    If your condition changes and/or worsens, please follow up with your primary care provider or return to [facility] for further evaluation.    If short of breath, chest pains or any other concerns please report to the emergency room.    In the event of an Emergency dial 911.      [employer] engineervoice recognition software utilized for part or all of this encounter note. Errors in grammar noted and changed to the best of provider's ability.       Supervising physician, Nelva BushBohdan Klymochko, DO, was immediately available.           Final Diagnosis  Final diagnoses:   [L03.031] Paronychia of great toe, right   [L03.031] Cellulitis of great toe of right foot (Primary)         Rachel BrittleAndrew Jimenez Kymari Lollis, PA              Rachel BrittlePolla, Rachel Massoud K, PA  06/23/18 1814       Rachel BrittlePolla, Rachel Jimenez,  [place]  06/23/18 [phone]

## 2018-06-24 LAB — GRAM STAIN

## 2018-06-26 LAB — AEROBIC CULTURE

## 2018-09-14 ENCOUNTER — Ambulatory Visit: Payer: Commercial Managed Care - PPO | Admitting: Family Medicine

## 2018-12-09 ENCOUNTER — Other Ambulatory Visit
Admission: RE | Admit: 2018-12-09 | Discharge: 2018-12-09 | Disposition: A | Discharge status: Home / Self Care | Payer: BLUE CROSS/BLUE SHIELD | Source: Ambulatory Visit | Attending: Obstetrics | Admitting: Obstetrics

## 2018-12-09 DIAGNOSIS — Z124 Encounter for screening for malignant neoplasm of cervix: Secondary | ICD-10-CM | POA: Insufficient documentation

## 2018-12-09 DIAGNOSIS — Z1151 Encounter for screening for human papillomavirus (HPV): Secondary | ICD-10-CM | POA: Insufficient documentation

## 2018-12-11 LAB — N. GONORRHOEAE DNA AMPLIFICATION: N. gonorrhoeae DNA Amplification: 0

## 2018-12-11 LAB — CHLAMYDIA PLASMID DNA AMPLIFICATION: Chlamydia Plasmid DNA Amplification: 0

## 2018-12-15 LAB — GYN CYTOLOGY

## 2019-05-16 ENCOUNTER — Ambulatory Visit
Admission: AD | Admit: 2019-05-16 | Discharge: 2019-05-16 | Disposition: A | Discharge status: Home / Self Care | Payer: Self-pay | Source: Ambulatory Visit | Attending: Family Medicine | Admitting: Family Medicine

## 2019-05-16 DIAGNOSIS — Z23 Encounter for immunization: Secondary | ICD-10-CM | POA: Insufficient documentation

## 2019-05-16 DIAGNOSIS — Z111 Encounter for screening for respiratory tuberculosis: Secondary | ICD-10-CM | POA: Insufficient documentation

## 2019-05-16 DIAGNOSIS — Z020 Encounter for examination for admission to educational institution: Secondary | ICD-10-CM | POA: Insufficient documentation

## 2019-05-16 MED ORDER — TETANUS-DIPHTH-ACELL PERT 5-2.5-18.5 LF-MCG/0.5 IM SUSP *WRAPPED*
0.5000 mL | Freq: Once | INTRAMUSCULAR | Status: AC
Start: 2019-05-16 — End: 2019-05-16
  Administered 2019-05-16: 10:00:00 0.5 mL via INTRAMUSCULAR

## 2019-05-16 NOTE — ED Triage Notes (Signed)
Patient needs a physical for school.       Triage Note   Billey Co, RN

## 2019-05-16 NOTE — UC Provider Note (Signed)
History     Chief Complaint   Patient presents with    School Physical     Patient needs a physical for school.     Patient presents for EMS school physical exam.  ROS negative.  Denies ETOH, drug or mental health concerns.            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 No pertinent family history.       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      Physical Exam   Triage Vitals  Triage Start: Start, (05/16/19 0940)   First Recorded BP: 138/80, Resp: 18, Temp: 36.3 C (97.3 F) Oxygen Therapy SpO2: 100 %, Heart Rate: 104, (05/16/19 0941)  .      Physical Exam  Vitals signs and nursing note reviewed.   Constitutional:       General: She is not in acute distress.     Appearance: She is well-developed.   HENT:      Head: Normocephalic and atraumatic.      Right Ear: Tympanic membrane and ear canal normal.      Left Ear: Tympanic membrane and  ear canal normal.      Nose: Nose normal.      Mouth/Throat:      Mouth: Mucous membranes are moist.      Pharynx: No posterior oropharyngeal erythema.   Eyes:      Extraocular Movements: Extraocular movements intact.      Conjunctiva/sclera: Conjunctivae normal.      Pupils: Pupils are equal, round, and reactive to light.   Neck:      Musculoskeletal: Neck supple.      Thyroid: No thyromegaly.   Cardiovascular:      Rate and Rhythm: Normal rate and regular rhythm.      Heart sounds: Normal heart sounds. No murmur. No friction rub. No  gallop.    Pulmonary:      Effort: Pulmonary effort is normal. No respiratory distress.      Breath sounds: Normal breath sounds. No wheezing or rales.   Abdominal:      Palpations: Abdomen is soft.      Tenderness: There is no abdominal tenderness.   Musculoskeletal:      Comments: Non tender spine;  Grossly normal exam.    Lymphadenopathy:      Cervical: No cervical adenopathy.   Skin:     General: Skin is warm and dry.   Neurological:      General: No focal deficit present.      Mental Status: She is alert and oriented to person, place, and time.   Psychiatric:         Behavior: Behavior normal.         Thought Content: Thought content normal.         Judgment: Judgment normal.          Medical Decision Making      Amount and/or Complexity of Data Reviewed  Review and summarize past medical records: yes        Initial Evaluation:  ED First Provider Contact     Date/Time Event User Comments    05/16/19 0930 ED First Provider Contact Janyce Llanos M Initial Face to Face Provider Contact          Patient was seen on: 05/16/2019        Assessment:  23 y.o.female comes to the Urgent Care Center with school physical exam         Plan: EMS school physical exam.    Forms completed.    Orders Placed This Encounter    TB Skin Test (PPD)    Tdap (BOOSTRIX): tetanus, diphtheria, and accellar pertussis injection 0.5 mL             Final Diagnosis    ICD-10-CM ICD-9-CM   1. School  physical exam  Z02.0 V70.5                Please follow up with your physician as below:  Given list of PCPs accepting new patients as requested.         Thank you Herby Abraham for coming to [facility] for your health care concerns.    If your condition changes and/or worsens please follow up with her primary doctor and/or return to the urgent care center.        In the event of an Emergency dial 911.           Final Diagnosis  Final diagnoses:   [Z02.0] School physical exam (Primary)         Alcus Dad, MD              Alcus Dad, MD  05/16/19 1029

## 2019-05-16 NOTE — Discharge Instructions (Signed)
The following primary care physicians are  currently accepting new patients:    [place] [facility] [place] [facility]  [address] [address]  [place], [place] [address]  [phone]  Lauri Carrier, MD (Peds)  Alexandria Harmon, MD (Peds)  Julie Lenhard, MD (Peds)    [facility]  [facility]  [address]  [place], [place] [address]  [phone]  Meghana Anugu, MD (IM)    [facility]   [facility]  [address]  [place], [place] [address]  [phone]  Leesha Hoilette, MD, MS (MP)    [facility]  [facility]  [address]  [place], [place]  [phone]  Nicola Levstik, MD (IM)    [facility]  [address]  [place], [place] [address]  [phone]  Tyler Batey, MD (FM) [place]  [facility]  [address]  [place]  [phone]  Please call for a list of physicians accepting new patients     [facility]  [address]  [place], [place] [address]  [phone]  Izabela Subczynska, MD (FM)    [facility]   [address] [place], [place] [address]   [phone]   Cheryl Kame, MD (Peds)   Stephanie Page, MD (Peds)   Max Steiner, MD (Peds)     [facility] [place] [facility]  [facility]   [address]  , [place] [address]  [phone]  Zoe Kostarellis, MD (FM)  Amanda Morrison, MD (FM)       [place]  [facility] [place]   [address]   [place], [place] [address]  [phone]   Prity Rawal, MD (FM)     [facility] [place]  [address]  [place], [place] [address]  [phone]  Scott Wilson, MD (FM)    [facility] [place]  [address]  [place], [place] [address]  [phone]  Aly Hadwani, MD    [place]  [facility]  [place]  [address]  [address]  [place],  [address]  [phone]  Joanne Nazareth, MD (IM)           FM: Family Medicine   IM: Internal Medicine  IM/Peds: Internal Medicine & Pediatrics  MP: Family Medicine & Pediatrics  OB Obstetrics  Peds: Pediatrics   For physician referral services, please call [phone] visit [email or web address]     Updated 05/04/2019

## 2019-05-19 ENCOUNTER — Other Ambulatory Visit
Admission: RE | Admit: 2019-05-19 | Discharge: 2019-05-19 | Disposition: A | Discharge status: Home / Self Care | Payer: BLUE CROSS/BLUE SHIELD | Source: Ambulatory Visit | Attending: Infectious Disease | Admitting: Infectious Disease

## 2019-05-19 ENCOUNTER — Ambulatory Visit
Admission: AD | Admit: 2019-05-19 | Discharge: 2019-05-19 | Disposition: A | Discharge status: Home / Self Care | Payer: BLUE CROSS/BLUE SHIELD | Source: Ambulatory Visit | Attending: Emergency Medicine | Admitting: Emergency Medicine

## 2019-05-19 DIAGNOSIS — U071 COVID-19: Secondary | ICD-10-CM | POA: Insufficient documentation

## 2019-05-19 DIAGNOSIS — Z111 Encounter for screening for respiratory tuberculosis: Secondary | ICD-10-CM | POA: Insufficient documentation

## 2019-05-19 LAB — TB SKIN TEST: Induration:TB skin test: 0 mm

## 2019-05-19 NOTE — ED Triage Notes (Signed)
PPD read 48mm induration in R arm       Triage Note   Allen Kell, RN

## 2019-05-22 LAB — COVID-19 PCR

## 2019-05-22 LAB — COVID-19 NAAT (PCR): COVID-19 NAAT (PCR): POSITIVE — AB

## 2019-09-26 ENCOUNTER — Other Ambulatory Visit: Payer: Self-pay

## 2019-09-26 ENCOUNTER — Emergency Department: Payer: No Typology Code available for payment source

## 2019-09-26 ENCOUNTER — Emergency Department
Admission: EM | Admit: 2019-09-26 | Discharge: 2019-09-26 | Disposition: A | Discharge status: Home / Self Care | Payer: No Typology Code available for payment source | Source: Ambulatory Visit | Attending: Emergency Medicine | Admitting: Emergency Medicine

## 2019-09-26 ENCOUNTER — Encounter: Payer: Self-pay | Admitting: Student in an Organized Health Care Education/Training Program

## 2019-09-26 ENCOUNTER — Emergency Department: Payer: No Typology Code available for payment source | Admitting: Radiology

## 2019-09-26 ENCOUNTER — Other Ambulatory Visit: Payer: Self-pay | Admitting: Cardiology

## 2019-09-26 DIAGNOSIS — S22069A Unspecified fracture of T7-T8 vertebra, initial encounter for closed fracture: Secondary | ICD-10-CM | POA: Insufficient documentation

## 2019-09-26 DIAGNOSIS — S22059A Unspecified fracture of T5-T6 vertebra, initial encounter for closed fracture: Secondary | ICD-10-CM | POA: Insufficient documentation

## 2019-09-26 DIAGNOSIS — S5002XA Contusion of left elbow, initial encounter: Secondary | ICD-10-CM

## 2019-09-26 DIAGNOSIS — S22008A Other fracture of unspecified thoracic vertebra, initial encounter for closed fracture: Secondary | ICD-10-CM

## 2019-09-26 DIAGNOSIS — S22079A Unspecified fracture of T9-T10 vertebra, initial encounter for closed fracture: Secondary | ICD-10-CM | POA: Insufficient documentation

## 2019-09-26 DIAGNOSIS — S3992XA Unspecified injury of lower back, initial encounter: Secondary | ICD-10-CM

## 2019-09-26 DIAGNOSIS — I499 Cardiac arrhythmia, unspecified: Secondary | ICD-10-CM

## 2019-09-26 DIAGNOSIS — M25521 Pain in right elbow: Secondary | ICD-10-CM | POA: Insufficient documentation

## 2019-09-26 DIAGNOSIS — M545 Low back pain: Secondary | ICD-10-CM

## 2019-09-26 DIAGNOSIS — S3991XA Unspecified injury of abdomen, initial encounter: Secondary | ICD-10-CM

## 2019-09-26 DIAGNOSIS — S22060A Wedge compression fracture of T7-T8 vertebra, initial encounter for closed fracture: Secondary | ICD-10-CM

## 2019-09-26 DIAGNOSIS — S199XXA Unspecified injury of neck, initial encounter: Secondary | ICD-10-CM

## 2019-09-26 DIAGNOSIS — Y9289 Other specified places as the place of occurrence of the external cause: Secondary | ICD-10-CM | POA: Insufficient documentation

## 2019-09-26 DIAGNOSIS — S299XXA Unspecified injury of thorax, initial encounter: Secondary | ICD-10-CM

## 2019-09-26 DIAGNOSIS — Y9389 Activity, other specified: Secondary | ICD-10-CM | POA: Insufficient documentation

## 2019-09-26 DIAGNOSIS — S0990XA Unspecified injury of head, initial encounter: Secondary | ICD-10-CM

## 2019-09-26 DIAGNOSIS — Y990 Civilian activity done for income or pay: Secondary | ICD-10-CM | POA: Insufficient documentation

## 2019-09-26 LAB — CBC AND DIFFERENTIAL
Baso # K/uL: 0 10*3/uL (ref 0.0–0.1)
Basophil %: 0.4 %
Eos # K/uL: 0.2 10*3/uL (ref 0.0–0.4)
Eosinophil %: 1.4 %
Hematocrit: 40 % (ref 34–45)
Hemoglobin: 13.4 g/dL (ref 11.2–15.7)
IMM Granulocytes #: 0.1 10*3/uL — ABNORMAL HIGH (ref 0.0–0.0)
IMM Granulocytes: 0.5 %
Lymph # K/uL: 2.7 10*3/uL (ref 1.2–3.7)
Lymphocyte %: 24.8 %
MCH: 27 pg (ref 26–32)
MCHC: 34 g/dL (ref 32–36)
MCV: 81 fL (ref 79–95)
Mono # K/uL: 0.7 10*3/uL (ref 0.2–0.9)
Monocyte %: 6.1 %
Neut # K/uL: 7.2 10*3/uL — ABNORMAL HIGH (ref 1.6–6.1)
Nucl RBC # K/uL: 0 10*3/uL (ref 0.0–0.0)
Nucl RBC %: 0 /100 WBC (ref 0.0–0.2)
Platelets: 253 10*3/uL (ref 160–370)
RBC: 4.9 MIL/uL (ref 3.9–5.2)
RDW: 12.8 % (ref 11.7–14.4)
Seg Neut %: 66.8 %
WBC: 10.8 10*3/uL — ABNORMAL HIGH (ref 4.0–10.0)

## 2019-09-26 LAB — BASIC METABOLIC PANEL
Anion Gap: 10 (ref 7–16)
CO2: 21 mmol/L (ref 20–28)
Calcium: 9.8 mg/dL (ref 9.0–10.4)
Chloride: 107 mmol/L (ref 96–108)
Creatinine: 0.8 mg/dL (ref 0.51–0.95)
GFR,Black: 120 *
GFR,Caucasian: 104 *
Glucose: 92 mg/dL (ref 60–99)
Lab: 12 mg/dL (ref 6–20)
Potassium: 3.8 mmol/L (ref 3.3–5.1)
Sodium: 138 mmol/L (ref 133–145)

## 2019-09-26 LAB — HOLD BLUE

## 2019-09-26 LAB — PREGNANCY TEST, SERUM: Preg,Serum: NEGATIVE

## 2019-09-26 LAB — TROPONIN T 0 HR HIGH SENSITIVITY (IP/ED ONLY): TROP T 0 HR High Sensitivity: <6 ng/L (ref 0–13)

## 2019-09-26 MED ORDER — ONDANSETRON HCL 2 MG/ML IV SOLN *I*
4.0000 mg | Freq: Once | INTRAMUSCULAR | Status: AC
Start: 2019-09-26 — End: 2019-09-26
  Administered 2019-09-26: 4 mg via INTRAVENOUS
  Filled 2019-09-26: qty 2

## 2019-09-26 MED ORDER — OXYCODONE HCL 5 MG PO TABS *I*
5.0000 mg | ORAL_TABLET | Freq: Four times a day (QID) | ORAL | Status: DC | PRN
Start: 2019-09-26 — End: 2019-09-26
  Administered 2019-09-26: 5 mg via ORAL
  Filled 2019-09-26: qty 1

## 2019-09-26 MED ORDER — CYCLOBENZAPRINE HCL 10 MG PO TABS *I*
10.0000 mg | ORAL_TABLET | Freq: Once | ORAL | Status: AC
Start: 2019-09-26 — End: 2019-09-26
  Administered 2019-09-26: 10 mg via ORAL
  Filled 2019-09-26: qty 1

## 2019-09-26 MED ORDER — HYDROMORPHONE HCL PF 1 MG/ML IJ SOLN *WRAPPED*
INTRAMUSCULAR | Status: AC
Start: 2019-09-26 — End: 2019-09-26
  Administered 2019-09-26: 1 mg via INTRAVENOUS
  Filled 2019-09-26: qty 1

## 2019-09-26 MED ORDER — HYDROMORPHONE HCL PF 1 MG/ML IJ SOLN *WRAPPED*
1.0000 mg | Freq: Once | INTRAMUSCULAR | Status: AC
Start: 2019-09-26 — End: 2019-09-26

## 2019-09-26 MED ORDER — IOHEXOL 350 MG/ML (OMNIPAQUE) IV SOLN *I*
1.0000 mL | Freq: Once | INTRAVENOUS | Status: AC
Start: 2019-09-26 — End: 2019-09-26
  Administered 2019-09-26: 126 mL via INTRAVENOUS

## 2019-09-26 MED ORDER — CYCLOBENZAPRINE HCL 5 MG PO TABS *I*
5.0000 mg | ORAL_TABLET | Freq: Three times a day (TID) | ORAL | 0 refills | Status: AC | PRN
Start: 2019-09-26 — End: 2019-10-26
  Filled 2019-09-26: qty 30, 10d supply, fill #0

## 2019-09-26 MED ORDER — ACETAMINOPHEN 500 MG PO TABS *I*
1000.0000 mg | ORAL_TABLET | Freq: Once | ORAL | Status: AC
Start: 2019-09-26 — End: 2019-09-26
  Administered 2019-09-26: 1000 mg via ORAL
  Filled 2019-09-26: qty 2

## 2019-09-26 MED ORDER — OXYCODONE HCL 5 MG PO TABS *I*
5.0000 mg | ORAL_TABLET | ORAL | 0 refills | Status: DC | PRN
Start: 2019-09-26 — End: 2019-11-23
  Filled 2019-09-26: qty 12, 4d supply, fill #0

## 2019-09-26 MED ORDER — KETOROLAC TROMETHAMINE 30 MG/ML IJ SOLN *I*
15.0000 mg | Freq: Once | INTRAMUSCULAR | Status: AC
Start: 2019-09-26 — End: 2019-09-26
  Administered 2019-09-26: 15 mg via INTRAVENOUS
  Filled 2019-09-26: qty 1

## 2019-09-26 NOTE — ED Notes (Signed)
Pt discharge instructions reviewed. Pt comfortable with discharge planning and verbalizes understanding of discharge instruction. Pt ambulatory without assistance and steady gait. Tolerated PO. Pt will follow up with PCP. Patient significant other driving patient home. Pt belongings with pt. Pt safe to discharge at this time.
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
## 2019-09-26 NOTE — ED Notes (Signed)
Assumed care of patient at this time. Patient is at imaging at this time.
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
## 2019-10-02 ENCOUNTER — Encounter: Payer: Self-pay | Admitting: Orthopedic Surgery

## 2019-10-05 LAB — EKG 12-LEAD
P: 33 deg
PR: 152 ms
QRS: 39 deg
QRSD: 84 ms
QT: 339 ms
QTc: 415 ms
Rate: 90 {beats}/min
T: 61 deg

## 2019-10-08 ENCOUNTER — Encounter: Payer: Self-pay | Admitting: Orthopedic Surgery

## 2019-10-08 ENCOUNTER — Other Ambulatory Visit: Payer: Self-pay | Admitting: Orthopedic Surgery

## 2019-10-08 ENCOUNTER — Ambulatory Visit
Admission: RE | Admit: 2019-10-08 | Discharge: 2019-10-08 | Disposition: A | Discharge status: Home / Self Care | Payer: No Typology Code available for payment source | Source: Ambulatory Visit

## 2019-10-08 ENCOUNTER — Ambulatory Visit: Payer: No Typology Code available for payment source | Admitting: Orthopedic Surgery

## 2019-10-08 VITALS — BP 154/87 | HR 93 | Ht 66.0 in | Wt 185.0 lb

## 2019-10-08 DIAGNOSIS — S22009A Unspecified fracture of unspecified thoracic vertebra, initial encounter for closed fracture: Secondary | ICD-10-CM

## 2019-10-08 DIAGNOSIS — M419 Scoliosis, unspecified: Secondary | ICD-10-CM

## 2019-10-08 DIAGNOSIS — S22008A Other fracture of unspecified thoracic vertebra, initial encounter for closed fracture: Secondary | ICD-10-CM
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
## 2019-11-03 ENCOUNTER — Telehealth: Payer: Self-pay

## 2019-11-03 NOTE — Telephone Encounter (Signed)
[facility] - Ambulatory Telephone Intake Screen     Patient Information:    Patient's Name: Rachel Jimenez  Patient's Date of Birth: [birth year]  Patient's Medical Record Number: [id number]  Patient's Home Phone: [phone] (home)  Patient's Work Phone: There is no work phone number on file.  Patient's Mobile Phone:   Telephone Information:   Mobile [phone]     Patient's Marital Status (if applicable): Single  Can a message be left? Yes    Insurance Information:    [employer] number: [id number]  Pt did not have insurance card during screen.      Referral Source:  Referral Source: Prev pt     Presenting Concern:     What is the reason you are seeking care?     (Please provide a brief description of the reasons the patient or referring provider is seeking mental health treatment at this time.  Please use patients own words where possible).    PTSD, depression, anxiety and eating disorder.                                    Mental Health History:    Are you currently involved in mental health treatment?  No     Are you currently taking a long acting injectable medication?  No    Will you need an injection at the time of your first intake assessment appointment?   No      Customer Service:    Do you need arrangements for?    Wheelchair: No    Interpreter Services:No    12/13 10:30am Para March

## 2019-11-23 ENCOUNTER — Other Ambulatory Visit: Payer: Self-pay | Admitting: Orthopedic Surgery

## 2019-11-23 ENCOUNTER — Ambulatory Visit: Payer: No Typology Code available for payment source | Admitting: Orthopedic Surgery

## 2019-11-23 ENCOUNTER — Ambulatory Visit
Admission: RE | Admit: 2019-11-23 | Discharge: 2019-11-23 | Disposition: A | Discharge status: Home / Self Care | Payer: No Typology Code available for payment source | Source: Ambulatory Visit

## 2019-11-23 ENCOUNTER — Encounter: Payer: Self-pay | Admitting: Orthopedic Surgery

## 2019-11-23 VITALS — BP 128/83 | HR 91 | Ht 66.0 in | Wt 185.0 lb

## 2019-11-23 DIAGNOSIS — S22009A Unspecified fracture of unspecified thoracic vertebra, initial encounter for closed fracture: Secondary | ICD-10-CM

## 2019-11-23 DIAGNOSIS — S22008A Other fracture of unspecified thoracic vertebra, initial encounter for closed fracture: Secondary | ICD-10-CM
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
## 2019-12-28 ENCOUNTER — Encounter: Payer: Self-pay | Admitting: Orthopedic Surgery

## 2019-12-28 ENCOUNTER — Ambulatory Visit: Payer: No Typology Code available for payment source | Admitting: Orthopedic Surgery

## 2019-12-28 VITALS — BP 148/76 | Ht 66.0 in | Wt 190.0 lb

## 2019-12-28 DIAGNOSIS — S22009A Unspecified fracture of unspecified thoracic vertebra, initial encounter for closed fracture: Secondary | ICD-10-CM

## 2019-12-28 DIAGNOSIS — M5412 Radiculopathy, cervical region: Secondary | ICD-10-CM
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
## 2020-01-30 ENCOUNTER — Telehealth: Payer: Self-pay | Admitting: Psychiatry

## 2020-01-30 NOTE — Telephone Encounter (Signed)
[facility] - Ambulatory Telephone Intake Screen     Patient Information:    Patient's Name: Rachel Jimenez  Patient's Date of Birth: 1995/09/04  Patient's Medical Record Number: [id number]  Patient's Home Phone: [phone] (home)  Patient's Work Phone: There is no work phone number on file.  Patient's Mobile Phone:   Telephone Information:   Mobile [phone]     Writer was unable to leave PT a detailed message due to patient's mailbox being full.  Writer to make a second attempt to reach PT regarding rescheduling of her appointment in December.

## 2020-02-05 NOTE — Telephone Encounter (Signed)
Patient Information:    Patient's Name: Rachel Jimenez  Patient's Date of Birth: [birth year]  Patient's Medical Record Number: [id number]  Patient's Home Phone: [phone] (home)  Patient's Work Phone: There is no work phone number on file.  Patient's Mobile Phone:   Telephone Information:   Mobile [phone]     Writer unable to leave PT a message.  A letter to be sent at this time.  If no response from PT by August 27th, appointment will be canceled.

## 2020-02-08 ENCOUNTER — Encounter: Payer: Self-pay | Admitting: Orthopedic Surgery

## 2020-02-08 ENCOUNTER — Ambulatory Visit: Payer: No Typology Code available for payment source | Admitting: Orthopedic Surgery

## 2020-02-08 VITALS — BP 129/76 | HR 86 | Ht 66.0 in | Wt 190.0 lb

## 2020-02-08 DIAGNOSIS — S22009A Unspecified fracture of unspecified thoracic vertebra, initial encounter for closed fracture: Secondary | ICD-10-CM
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
## 2020-02-09 ENCOUNTER — Other Ambulatory Visit
Admission: RE | Admit: 2020-02-09 | Discharge: 2020-02-09 | Disposition: A | Discharge status: Home / Self Care | Payer: BLUE CROSS/BLUE SHIELD | Source: Ambulatory Visit

## 2020-02-09 LAB — CBC AND DIFFERENTIAL
Baso # K/uL: 0.1 10*3/uL (ref 0.0–0.1)
Basophil %: 0.6 %
Eos # K/uL: 0.2 10*3/uL (ref 0.0–0.4)
Eosinophil %: 1.9 %
Hematocrit: 40 % (ref 34–45)
Hemoglobin: 13.6 g/dL (ref 11.2–15.7)
IMM Granulocytes #: 0 10*3/uL (ref 0.0–0.0)
IMM Granulocytes: 0.5 %
Lymph # K/uL: 2.8 10*3/uL (ref 1.2–3.7)
Lymphocyte %: 32.2 %
MCH: 28 pg (ref 26–32)
MCHC: 34 g/dL (ref 32–36)
MCV: 83 fL (ref 79–95)
Mono # K/uL: 0.6 10*3/uL (ref 0.2–0.9)
Monocyte %: 6.7 %
Neut # K/uL: 5 10*3/uL (ref 1.6–6.1)
Nucl RBC # K/uL: 0 10*3/uL (ref 0.0–0.0)
Nucl RBC %: 0 /100 WBC (ref 0.0–0.2)
Platelets: 271 10*3/uL (ref 160–370)
RBC: 4.8 MIL/uL (ref 3.9–5.2)
RDW: 12.4 % (ref 11.7–14.4)
Seg Neut %: 58.1 %
WBC: 8.6 10*3/uL (ref 4.0–10.0)

## 2020-03-02 ENCOUNTER — Ambulatory Visit: Payer: BLUE CROSS/BLUE SHIELD | Admitting: Podiatry

## 2020-03-02 DIAGNOSIS — L600 Ingrowing nail: Secondary | ICD-10-CM

## 2020-03-02 NOTE — Progress Notes (Signed)
Chief Complaint   Patient presents with    Right Foot - New Patient Visit, Nail Problem     Right 1st toenail getting ingrown          right foot pain  What is your goal for today's visit?:  Ingrown toenail  Date of onset:  02/03/2020  Was this the result of an injury?: No    Pain level:  1/10  Pain quality:  Stabbing  Diagnostic workup:  No prior workup  Treatments tried:  No previous treatments  Progression since onset:  Waxing and waning  Work related condition?: No    Work status:  Usual activities    Presents today with ingrowing of the right great toenail lateral nail border.  She has had chronic history of ingrowing of this nail.  She does have history of infection of the nail.  This was over 2 years ago.  She does care for herself with hydrogen peroxide and saline and has had medical evaluation of it in the past as well.  Currently there is some pain but she does not believe infection.    Review of Systems    PMH, Medications, Allergies, Social history, Surgical history, and Family history  were reviewed and documented in the chart.    Exam:  Vascular:    Pedal pulses -2/4 dorsalis pedis and posterior tibial pulse to the right foot.  Musculoskeletal:   Rectus right great toe.    Integument:  Right hallux toenail is ingrown laterally with a nail embedded in the lateral nail border distally.  It is tender to palpation.  There is no redness, warmth, edema, or drainage.    Neurologic:  Light touch sensation is grossly intact to the right foot.    Psychiatric:  Awake and alert  Appears of normal mood and affect     Constitutional:    Appears well developed  There is no height or weight on file to calculate BMI.    Labs/Radiographs/Tests:     Assessment and Plan:      ICD-10-CM ICD-9-CM   1. Ingrown nail  L60.0 703.0     She does have chronic ingrown toenail.  Reviewed treatment options to include home care, periodic debridement, or phenol and alcohol matrixectomy.  After discussion of options she does not elect  permanent procedure.  The nail was able to be debrided in a slant back fashion to remove the ingrown portion of nail to reduce pain and risk of secondary infection.  She will contact the office should she have recurrent pain or sign of infection.  No orders of the defined types were placed in this encounter.      No follow-ups on file.

## 2020-05-02 ENCOUNTER — Encounter: Payer: Self-pay | Admitting: Orthopedic Surgery

## 2020-05-02 ENCOUNTER — Ambulatory Visit: Payer: No Typology Code available for payment source | Admitting: Orthopedic Surgery

## 2020-05-02 VITALS — BP 136/82 | HR 76 | Ht 67.0 in | Wt 190.0 lb

## 2020-05-02 DIAGNOSIS — S22009A Unspecified fracture of unspecified thoracic vertebra, initial encounter for closed fracture: Secondary | ICD-10-CM
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
## 2020-06-05 ENCOUNTER — Telehealth: Payer: Self-pay

## 2020-06-10 ENCOUNTER — Encounter: Payer: Self-pay | Admitting: Podiatry

## 2020-07-25 ENCOUNTER — Encounter: Payer: Self-pay | Admitting: Orthopedic Surgery

## 2020-07-25 ENCOUNTER — Ambulatory Visit: Payer: No Typology Code available for payment source | Admitting: Orthopedic Surgery

## 2020-07-25 VITALS — BP 131/79 | HR 96 | Ht 67.0 in | Wt 170.0 lb

## 2020-07-25 DIAGNOSIS — S22008A Other fracture of unspecified thoracic vertebra, initial encounter for closed fracture: Secondary | ICD-10-CM
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
## 2020-08-03 ENCOUNTER — Encounter: Payer: Self-pay | Admitting: Orthopedic Surgery

## 2020-09-07 ENCOUNTER — Other Ambulatory Visit
Admission: RE | Admit: 2020-09-07 | Discharge: 2020-09-07 | Disposition: A | Discharge status: Home / Self Care | Payer: BLUE CROSS/BLUE SHIELD | Source: Ambulatory Visit | Attending: Obstetrics | Admitting: Obstetrics

## 2020-09-07 DIAGNOSIS — Z118 Encounter for screening for other infectious and parasitic diseases: Secondary | ICD-10-CM | POA: Insufficient documentation

## 2020-09-07 DIAGNOSIS — Z01419 Encounter for gynecological examination (general) (routine) without abnormal findings: Secondary | ICD-10-CM | POA: Insufficient documentation

## 2020-09-07 DIAGNOSIS — Z113 Encounter for screening for infections with a predominantly sexual mode of transmission: Secondary | ICD-10-CM | POA: Insufficient documentation

## 2020-09-07 DIAGNOSIS — Z124 Encounter for screening for malignant neoplasm of cervix: Secondary | ICD-10-CM | POA: Insufficient documentation

## 2020-09-07 LAB — N. GONORRHOEAE DNA AMPLIFICATION: N. gonorrhoeae DNA Amplification: 0

## 2020-09-07 LAB — CHLAMYDIA PLASMID DNA AMPLIFICATION: Chlamydia Plasmid DNA Amplification: 0

## 2020-09-15 LAB — GYN CYTOLOGY

## 2020-10-24 ENCOUNTER — Encounter: Payer: Self-pay | Admitting: Orthopedic Surgery

## 2020-10-24 ENCOUNTER — Ambulatory Visit: Payer: No Typology Code available for payment source | Admitting: Orthopedic Surgery

## 2020-10-24 VITALS — BP 135/79 | HR 86 | Ht 66.0 in | Wt 170.0 lb

## 2020-10-24 DIAGNOSIS — S22008A Other fracture of unspecified thoracic vertebra, initial encounter for closed fracture: Secondary | ICD-10-CM
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
## 2020-11-03 ENCOUNTER — Other Ambulatory Visit: Payer: Self-pay

## 2020-11-07 ENCOUNTER — Ambulatory Visit: Payer: No Typology Code available for payment source | Admitting: Orthopedic Surgery

## 2020-11-09 ENCOUNTER — Other Ambulatory Visit: Payer: Self-pay | Admitting: Gastroenterology

## 2020-11-14 ENCOUNTER — Encounter: Payer: Self-pay | Admitting: Orthopedic Surgery

## 2020-11-14 ENCOUNTER — Ambulatory Visit: Payer: No Typology Code available for payment source | Admitting: Orthopedic Surgery

## 2020-11-14 VITALS — BP 124/90 | HR 86 | Ht 66.0 in | Wt 168.0 lb

## 2020-11-14 DIAGNOSIS — M5124 Other intervertebral disc displacement, thoracic region: Secondary | ICD-10-CM

## 2020-11-14 DIAGNOSIS — M5134 Other intervertebral disc degeneration, thoracic region: Secondary | ICD-10-CM

## 2020-11-14 DIAGNOSIS — S22008A Other fracture of unspecified thoracic vertebra, initial encounter for closed fracture: Secondary | ICD-10-CM
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
## 2020-12-24 ENCOUNTER — Encounter: Payer: Self-pay | Admitting: Emergency Medicine

## 2020-12-24 ENCOUNTER — Ambulatory Visit
Admission: AD | Admit: 2020-12-24 | Discharge: 2020-12-24 | Disposition: A | Discharge status: Home / Self Care | Payer: BLUE CROSS/BLUE SHIELD | Source: Ambulatory Visit | Attending: Emergency Medicine | Admitting: Emergency Medicine

## 2020-12-24 DIAGNOSIS — J029 Acute pharyngitis, unspecified: Secondary | ICD-10-CM | POA: Insufficient documentation

## 2020-12-24 LAB — POCT AMBULATORY RAPID STREP
Lot #: 221146
Rapid Strep Group A Throat-POC: NEGATIVE

## 2020-12-24 LAB — POCT MONO
Lot #: 211259
MONO, POC: NEGATIVE

## 2020-12-24 NOTE — ED Triage Notes (Signed)
Patient states sore throat started a week ago.  States since then has started to feel generally unwell in the past couple days with headaches and upset stomach.  States currently does not have PCP.  Has not tested for COVID.

## 2020-12-24 NOTE — Discharge Instructions (Signed)
Rest and drink plenty of fluids.  If your appetite is decreased, focus on hydration.     Warm Salt water gargles can help alleviate throat discomfort    Cold drinks and food such as popsicles, ice chips, ice cream, and milkshakes can help soothe throat    You may try throat lozenges or sprays for comfort such as Cepacol or Chloraspetic spray.    Ibuprofen can be taken every 6 hours for the next 3 days. Take with food to avoid stomach upset    Your rapid strep test was negative; we will send your throat swab to the lab for a culture. You will only be notified if the result is positive.

## 2020-12-24 NOTE — UC Provider Note (Signed)
History     Chief Complaint   Patient presents with    Sore Throat     25 year old female who is generally feeling unwell for the past week here for concerns of sore throat and sore along the right tonsil area.  She reports that she has had intermittent headaches and an upset stomach, denies COVID concern, no testing at home.  Denies any fevers, chills.      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       Social History     Tobacco Use    Smoking status: Current Every Day Smoker     Types: Vaping    Smokeless tobacco: Never Used    Tobacco comment: Pt denies    Substance Use Topics    Alcohol use: Yes     Comment: Pt states she ahs about a 0.5 bottle of wiskey    Drug use: Yes     Types: Marijuana     Comment: Pt states she uses "a couple time s a week" last use was 2 days ago     Living Situation     Questions Responses    Patient lives with Alone    Homeless No    Caregiver for other family member No    Pharmacologist    Employment Student    Comment: [employer]     Domestic Violence Risk No                Review of Systems   Review of Systems   Constitutional: Negative.    HENT: Positive for sore throat.    Eyes: Negative.    Respiratory: Negative.    Cardiovascular: Negative.    Gastrointestinal: Positive for nausea.   Endocrine: Negative.    Genitourinary: Negative.    Musculoskeletal: Negative.    Skin: Negative.     Allergic/Immunologic: Negative.    Neurological: Positive for headaches.   Hematological: Negative.    Psychiatric/Behavioral: Negative.        Physical Exam   Triage Vitals  Triage Start: Start, (12/24/20 1137)   First Recorded BP: 124/86, Temp: 36.2 C (97.2 F), Temp src: TEMPORAL Oxygen Therapy SpO2: 98 %, Oximetry Source: Lt Hand, O2 Device: None (Room air), Heart Rate: 86, (12/24/20 1137)  .  First Pain Reported  0-10 Scale: 6, Pain Location/Orientation: Throat, (12/24/20 1137)       Physical Exam  Vitals and nursing note reviewed.   Constitutional:       General: She is not in acute distress.     Appearance: Normal appearance. She is well-developed and well-groomed. She is not ill-appearing, toxic-appearing or diaphoretic.   HENT:      Head:  Normocephalic and atraumatic.      Right Ear: Hearing, tympanic membrane, ear canal and external ear normal.      Left Ear: Hearing, tympanic membrane, ear canal and external ear normal.      Nose: Nose normal.      Mouth/Throat:      Lips: Pink.      Mouth: Mucous membranes are moist.      Pharynx: Uvula midline. No posterior oropharyngeal erythema or uvula swelling.      Tonsils: No tonsillar exudate or tonsillar abscesses. 1+ on the right. 1+ on the left.     Eyes:      General: Lids are normal.      Extraocular Movements: Extraocular movements intact.      Conjunctiva/sclera: Conjunctivae normal.      Pupils: Pupils are equal, round, and reactive to light.   Cardiovascular:      Rate and Rhythm: Normal rate and regular rhythm.      Heart sounds: Normal heart sounds.   Pulmonary:      Effort: Pulmonary effort is normal.      Breath sounds: Normal breath sounds. No decreased breath sounds, wheezing, rhonchi or rales.   Musculoskeletal:         General: Normal range of motion.      Cervical back: Normal range of motion.      Right lower leg: No edema.      Left lower leg: No edema.   Lymphadenopathy:      Head:      Right side of head: No submental, submandibular,  tonsillar or preauricular adenopathy.      Left side of head: No submental, submandibular, tonsillar or preauricular adenopathy.      Cervical: No cervical adenopathy.   Skin:     General: Skin is warm.   Neurological:      General: No focal deficit present.      Mental Status: She is alert and oriented to person, place, and time.      Gait: Gait is intact.   Psychiatric:         Attention and Perception: Attention normal.         Speech: Speech normal.         Behavior: Behavior normal. Behavior is cooperative.         Thought Content: Thought content normal.         Cognition and Memory: Cognition normal.          Medical Decision Making        Medical Decision Making  Assessment:    25 year old female with generally feeling unwell for 1 week with sore throat, rapid strep is negative, mono negative, patient declines COVID testing.    Differential diagnosis:    Viral Pharyngitis  Strep Pharyngitis  Post Nasal Drip  Acute URI  Nasopharyngitis   Tonsillitis  Mono  Thrush      Plan and Results:              Orders Placed This Encounter    Strep A culture, throat    POCT rapid strep    POCT mono       Recent Results (from the past 24 hour(s))   POCT rapid strep    Collection Time: 12/24/20 11:57 AM   Result Value Ref Range    Rapid Strep Group A Throat-POC Negative for Streptococcus Group A Antigen Negative    INTERNAL CONTROL RAPID STREP POCT *Yes-internal procedural control(s) acceptable  Exp date 03/23/22     Lot # [id number]    POCT mono    Collection Time: 12/24/20 12:01 PM   Result Value Ref Range    MONO, POC Negative Negative    INTERNAL CONTROL POCT MONO *Yes-internal procedural control(s) acceptable     Expiration Date 9/22     Lot # [id number]          Final Diagnosis  Final diagnoses:   [J02.9] Sore throat (Primary)       Rest, hydration, and good hand hygiene recommended.  Symptom relief, warning signs and infection control reviewed.  Use of relevant over the counter and already prescribed medications  discussed.    Please start the new medications as below:  Current Discharge Medication List          Please follow up with your physician as below:   Follow-up Information     Provider, Unknown In 1 week.    Why: As needed  Contact information:  [address]  [place] [address]                         Discharge Instructions       Rest and drink plenty of fluids.  If your appetite is decreased, focus on hydration.     Warm Salt water gargles can help alleviate throat discomfort    Cold drinks and food such as popsicles, ice chips, ice cream, and milkshakes can help soothe throat    You may try throat lozenges or sprays for comfort such as Cepacol or Chloraspetic spray.    Ibuprofen can be taken every 6 hours for the next 3 days. Take with food to avoid stomach upset    Your rapid strep test was negative; we will send your throat swab to the lab for a culture. You will only be notified if the result is positive.            Final Diagnosis  Final diagnoses:   [J02.9] Sore throat (Primary)         Dwight Adamczak Margy Clarks, PA              [facility], [name]  12/24/20 1256

## 2020-12-26 LAB — STREP A CULTURE, THROAT: Group A Strep Throat Culture: 0

## 2021-01-10 ENCOUNTER — Encounter: Payer: Self-pay | Admitting: Podiatry

## 2021-02-08 ENCOUNTER — Ambulatory Visit: Payer: Self-pay | Admitting: Podiatry

## 2021-02-13 ENCOUNTER — Encounter: Payer: Self-pay | Admitting: Orthopedic Surgery

## 2021-02-13 ENCOUNTER — Ambulatory Visit: Payer: No Typology Code available for payment source | Admitting: Orthopedic Surgery

## 2021-02-13 VITALS — Ht 66.0 in | Wt 160.0 lb

## 2021-02-13 DIAGNOSIS — M5124 Other intervertebral disc displacement, thoracic region: Secondary | ICD-10-CM

## 2021-02-13 DIAGNOSIS — M5134 Other intervertebral disc degeneration, thoracic region: Secondary | ICD-10-CM

## 2021-02-13 DIAGNOSIS — S22009A Unspecified fracture of unspecified thoracic vertebra, initial encounter for closed fracture: Secondary | ICD-10-CM
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
## 2021-05-28 ENCOUNTER — Ambulatory Visit: Payer: BLUE CROSS/BLUE SHIELD | Attending: Emergency Medicine | Admitting: Emergency Medicine

## 2021-05-28 ENCOUNTER — Other Ambulatory Visit: Payer: Self-pay

## 2021-05-28 VITALS — BP 160/88 | HR 109 | Temp 98.2°F | Resp 19

## 2021-05-28 DIAGNOSIS — J101 Influenza due to other identified influenza virus with other respiratory manifestations: Secondary | ICD-10-CM

## 2021-05-28 DIAGNOSIS — Z20822 Contact with and (suspected) exposure to covid-19: Secondary | ICD-10-CM | POA: Insufficient documentation

## 2021-05-28 DIAGNOSIS — J069 Acute upper respiratory infection, unspecified: Secondary | ICD-10-CM | POA: Insufficient documentation

## 2021-05-28 DIAGNOSIS — Z20828 Contact with and (suspected) exposure to other viral communicable diseases: Secondary | ICD-10-CM | POA: Insufficient documentation

## 2021-05-28 NOTE — Patient Instructions (Signed)
Rest and drink plenty of fluids.  Run a humidifier in your bedroom at night  Wash hands frequently to prevent the spread of infection.    Motrin or tylenol every 6 hours as needed for pain.    Mucinex can help to break up mucous and may be easier to cough it up. You can take this every 12 hours for the next 3-5 days.     Delsym OTC as needed for cough.    Warm salt water gargles and hot tea for a sore throat.  Throat lozengers as needed.    Flonase nasal spray is helpful for nasal congestion and post nasal drip     Saline nasal spray every 4 hours for the next 2-5 days. Netti Pot Sinus rinse can be helpful. Use up to twice daily- once in the morning and once before bed to help with nasal congestion.    Shower steam can be helpful to loosen any mucous and relief congestion. You can try this before bed, or when you get up in the morning.    Elevate your head at night with an extra pillow to help with congestion/reducing cough.    Please seek medical attention if you develop worsening sore throat, fevers, shortness of breath or difficulty breathing.

## 2021-05-28 NOTE — UC Provider Note (Signed)
Chief Complaint:   Chief Complaint   Patient presents with    Fever     Patient complains of fevers, body aches, productive cough,and diarrhea. S/S noted w/in the last few days.        HPI:  25 y.o. female w hx as noted in chart presents to Urgent Care for URI symptoms x3 days.     Symptoms are progressively worsened    Symptoms include aches, cough, congestion, chest tightness    Denies fevers    COVID exposure / other sick contacts: yes  COVID 19 positive in the last 3 months: no  Fully vaccinated for COVID 19: yes    VITALS:  BP 160/88    Pulse 109    Temp 36.8 C (98.2 F) (Temporal)    Resp 19    SpO2 100%     ROS: see HPI above      PHYSICAL EXAM:  Appearance: Well appearing, no acute distress   Eyes: no conjunctival injection or drainage   Ears: Normal TMs and canals bilaterally   Nose: congestion, frontal and maxillary sinuses non tender to palpation   Mouth/Throat: mild erythema of oropharynx, no petechiae or exudate, no PTA   Neck: No cervical LAD, no tenderness, full AROM   CV: RRR, no murmur   Pulm: no acute respiratory distress, Lungs clear to auscultation bilaterally   Skin: no pallor or rash     MEDICAL DECISION MAKING:  Assessment: 25 y.o. female w hx as noted in chart presents to UC with days of flulike illness.    Differential Diagnosis: Viral URI w cough, Bronchitis, Acute nasopharyngitis, Sinusitis, COVID 19    Plan and Results:     Orders Placed This Encounter    Influenza A PCR    Influenza B PCR    RSV PCR    COVID-19 PCR       No results found for this visit on 05/28/21.    Diagnosis and Disposition:  1. Viral URI with cough        Patient Instructions   Rest and drink plenty of fluids.  Run a humidifier in your bedroom at night  Wash hands frequently to prevent the spread of infection.    Motrin or tylenol every 6 hours as needed for pain.    Mucinex can help to break up mucous and may be easier to cough it up. You can take this every 12 hours for the next 3-5 days.     Delsym OTC as  needed for cough.    Warm salt water gargles and hot tea for a sore throat.  Throat lozengers as needed.    Flonase nasal spray is helpful for nasal congestion and post nasal drip     Saline nasal spray every 4 hours for the next 2-5 days. Netti Pot Sinus rinse can be helpful. Use up to twice daily- once in the morning and once before bed to help with nasal congestion.    Shower steam can be helpful to loosen any mucous and relief congestion. You can try this before bed, or when you get up in the morning.    Elevate your head at night with an extra pillow to help with congestion/reducing cough.    Please seek medical attention if you develop worsening sore throat, fevers, shortness of breath or difficulty breathing.        Encourage fluids, rest, & good hand hygiene.    Use over the counter medications as discussed.  Please start the new medications as below:        Please follow up with your physician as below:    Follow up if symptoms worsen or fail to improve.    Given the patient's reassuring vital signs and overall well appearance, the patient can be managed safely at home. Patient advised to call PCP or return to urgent care for any worsening or concerning symptoms as reviewed and provided on AVS.

## 2021-05-29 LAB — RSV PCR: RSV PCR: 0

## 2021-05-29 LAB — INFLUENZA A: Influenza A PCR: POSITIVE — AB

## 2021-05-29 LAB — COVID-19 NAAT (PCR): COVID-19 NAAT (PCR): NEGATIVE

## 2021-05-29 LAB — INFLUENZA B PCR: Influenza B PCR: 0

## 2021-05-29 NOTE — Result Encounter Note (Signed)
Influenza PCR result is positive and communicated to patient/guardian via MyChart message with additional recommendations for supportive care, treatment, monitoring and follow up precautions.

## 2021-07-30 ENCOUNTER — Other Ambulatory Visit: Payer: Self-pay | Admitting: Gastroenterology

## 2021-08-10 ENCOUNTER — Other Ambulatory Visit: Payer: Self-pay

## 2021-08-10 ENCOUNTER — Ambulatory Visit: Payer: No Typology Code available for payment source | Admitting: Orthopedic Surgery

## 2021-08-10 ENCOUNTER — Encounter: Payer: Self-pay | Admitting: Orthopedic Surgery

## 2021-08-10 VITALS — BP 126/62 | HR 84 | Ht 66.0 in | Wt 150.0 lb

## 2021-08-10 DIAGNOSIS — S22009A Unspecified fracture of unspecified thoracic vertebra, initial encounter for closed fracture: Secondary | ICD-10-CM
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
## 2021-08-17 ENCOUNTER — Ambulatory Visit: Payer: No Typology Code available for payment source | Admitting: Orthopedic Surgery

## 2022-02-01 ENCOUNTER — Ambulatory Visit: Payer: No Typology Code available for payment source | Admitting: Orthopedic Surgery

## 2022-02-01 ENCOUNTER — Encounter: Payer: Self-pay | Admitting: Orthopedic Surgery

## 2022-02-01 ENCOUNTER — Other Ambulatory Visit: Payer: Self-pay

## 2022-02-01 VITALS — BP 134/74 | HR 88 | Ht 66.0 in | Wt 140.0 lb

## 2022-02-01 DIAGNOSIS — S22009A Unspecified fracture of unspecified thoracic vertebra, initial encounter for closed fracture: Secondary | ICD-10-CM

## 2022-02-01 DIAGNOSIS — M5134 Other intervertebral disc degeneration, thoracic region: Secondary | ICD-10-CM

## 2022-02-01 MED ORDER — CYCLOBENZAPRINE HCL 5 MG PO TABS *I*
5.0000 mg | ORAL_TABLET | Freq: Three times a day (TID) | ORAL | 1 refills | Status: DC | PRN
Start: 2022-02-01 — End: 2023-07-28
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
## 2022-06-02 LAB — UNMAPPED LAB RESULTS
Basophil # (HT): 0.1 10 3/uL (ref 0.0–0.2)
Basophil % (HT): 1 % (ref 0–3)
Eosinophil # (HT): 0.2 10 3/uL (ref 0.0–0.6)
Eosinophil % (HT): 2 % (ref 0–5)
Hematocrit (HT): 38 % (ref 35–47)
Hemoglobin (HGB) (HT): 13 g/dL (ref 12.0–16.0)
Lymphocyte # (HT): 2.6 10 3/uL (ref 1.0–4.8)
Lymphocyte % (HT): 34 % (ref 15–45)
MCHC (HT): 34.7 g/dL (ref 31.0–37.5)
MCV (HT): 83 fL (ref 80–100)
Mean Corpuscular Hemoglobin (MCH) (HT): 28.8 pg (ref 26.0–34.0)
Monocyte # (HT): 0.5 10 3/uL (ref 0.1–1.0)
Monocyte % (HT): 7 % (ref 0–15)
Neutrophil # (HT): 4.2 10 3/uL (ref 1.8–8.0)
Platelets (HT): 236 10 3/uL (ref 150–450)
RBC (HT): 4.51 10 6/uL (ref 3.80–5.20)
RDW (HT): 13.1 % (ref 0.0–15.2)
Seg Neut % (HT): 56 % (ref 45–75)
WBC (HT): 7.5 10 3/uL (ref 4.0–11.0)

## 2022-06-19 ENCOUNTER — Other Ambulatory Visit: Payer: Self-pay

## 2022-06-19 ENCOUNTER — Encounter: Payer: Self-pay | Admitting: Medical

## 2022-06-19 ENCOUNTER — Emergency Department
Admission: EM | Admit: 2022-06-19 | Discharge: 2022-06-19 | Disposition: A | Discharge status: Home / Self Care | Payer: No Typology Code available for payment source | Source: Ambulatory Visit | Attending: Emergency Medicine | Admitting: Emergency Medicine

## 2022-06-19 ENCOUNTER — Emergency Department: Payer: No Typology Code available for payment source

## 2022-06-19 DIAGNOSIS — Y990 Civilian activity done for income or pay: Secondary | ICD-10-CM | POA: Insufficient documentation

## 2022-06-19 DIAGNOSIS — Y9289 Other specified places as the place of occurrence of the external cause: Secondary | ICD-10-CM | POA: Insufficient documentation

## 2022-06-19 DIAGNOSIS — R200 Anesthesia of skin: Secondary | ICD-10-CM

## 2022-06-19 DIAGNOSIS — S6991XA Unspecified injury of right wrist, hand and finger(s), initial encounter: Secondary | ICD-10-CM | POA: Insufficient documentation

## 2022-06-19 DIAGNOSIS — Y9389 Activity, other specified: Secondary | ICD-10-CM | POA: Insufficient documentation

## 2022-06-19 DIAGNOSIS — W230XXA Caught, crushed, jammed, or pinched between moving objects, initial encounter: Secondary | ICD-10-CM | POA: Insufficient documentation

## 2022-06-19 MED ORDER — ACETAMINOPHEN 325 MG PO TABS *I*
650.0000 mg | ORAL_TABLET | Freq: Once | ORAL | Status: AC
Start: 2022-06-19 — End: 2022-06-19
  Administered 2022-06-19: 650 mg via ORAL
  Filled 2022-06-19: qty 2

## 2022-06-19 NOTE — ED Notes (Signed)
Patient discharged by provider. Will remove patient from the system.
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
## 2022-06-19 NOTE — Discharge Instructions (Addendum)
You were seen and evaluated in the [facility] Emergency Department today for right ring finger pain. Your workup included an x-ray which showed no acute fracture or dislocation. Please follow up with your primary care provider. You may take ibuprofen/tylenol and use ice for pain. Apply ice for 20 minutes 4x/day.     Medications:  - You may alternate Ibuprofen and Acetaminophen (Tylenol) as needed for pain.   You can use the Ibuprofen 600mg  by mouth every six hours and the Acetaminophen 650mg  by mouth every six hours.   Take Ibuprofen with food to prevent stomach discomfort. Maximum daily dose of Tylenol is 3g (3000mg ). Do not exceed 3g in one day.  Example Schedule:  8am: Tylenol   11am: Ibuprofen  2pm: Tylenol   5pm: Ibuprofen  8pm: Tylenol   Please continue to take all home medications as previously prescribed.    Follow Up:  Please call your primary care provider and notify them that you were seen in the emergency department. Please follow-up with them in 3-5 days for re-evaluation.    Other Information:   I have reviewed your vital signs and imaging studies that were performed today. After thorough evaluation it was determined that you are safe for discharge at this time as you do not have a condition requiring admission to the hospital. Please read the attached information for more information about your diagnosis and treatment.    If your symptoms worsen, or you develop new symptoms such as blue discoloration to the finger, worsening numbness/tingling, any uncontrolled or worsening pain, or for any other concerns please return to the Emergency Department as soon as possible.      Thank you for allowing [place] to care for you.
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
## 2022-06-20 ENCOUNTER — Telehealth: Payer: Self-pay

## 2022-06-20 NOTE — Telephone Encounter (Signed)
Writer sent patient a Mychart about a new patient appointment per referral.

## 2022-08-08 ENCOUNTER — Other Ambulatory Visit: Payer: Self-pay

## 2022-08-08 ENCOUNTER — Encounter: Payer: Self-pay | Admitting: Orthopedic Surgery

## 2022-08-08 ENCOUNTER — Ambulatory Visit: Payer: Worker's Compensation | Admitting: Orthopedic Surgery

## 2022-08-08 VITALS — BP 123/76 | HR 81 | Ht 66.0 in | Wt 135.0 lb

## 2022-08-08 DIAGNOSIS — S22009A Unspecified fracture of unspecified thoracic vertebra, initial encounter for closed fracture: Secondary | ICD-10-CM

## 2022-08-08 MED ORDER — MELOXICAM 15 MG PO TABS *I*
15.0000 mg | ORAL_TABLET | Freq: Every day | ORAL | 1 refills | Status: DC
Start: 2022-08-08 — End: 2022-10-07

## 2022-08-08 MED ORDER — METHYLPREDNISOLONE 4 MG PO TBPK *A*
ORAL_TABLET | ORAL | 0 refills | Status: DC
Start: 2022-08-08 — End: 2023-10-28
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
## 2022-08-23 ENCOUNTER — Ambulatory Visit: Payer: BLUE CROSS/BLUE SHIELD | Admitting: Orthopedic Surgery

## 2022-08-23 ENCOUNTER — Other Ambulatory Visit: Payer: Self-pay

## 2022-08-23 ENCOUNTER — Ambulatory Visit
Admission: RE | Admit: 2022-08-23 | Discharge: 2022-08-23 | Disposition: A | Discharge status: Home / Self Care | Payer: BLUE CROSS/BLUE SHIELD | Source: Ambulatory Visit

## 2022-08-23 ENCOUNTER — Encounter: Payer: Self-pay | Admitting: Orthopedic Surgery

## 2022-08-23 VITALS — BP 109/68 | HR 95 | Ht 66.0 in | Wt 135.0 lb

## 2022-08-23 DIAGNOSIS — S4990XA Unspecified injury of shoulder and upper arm, unspecified arm, initial encounter: Secondary | ICD-10-CM

## 2022-08-23 DIAGNOSIS — M5412 Radiculopathy, cervical region: Secondary | ICD-10-CM

## 2022-08-23 DIAGNOSIS — S4991XA Unspecified injury of right shoulder and upper arm, initial encounter: Secondary | ICD-10-CM
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
## 2022-09-04 ENCOUNTER — Ambulatory Visit
Admission: RE | Admit: 2022-09-04 | Discharge: 2022-09-04 | Disposition: A | Discharge status: Home / Self Care | Payer: BLUE CROSS/BLUE SHIELD | Source: Ambulatory Visit | Attending: Orthopedic Surgery | Admitting: Orthopedic Surgery

## 2022-09-04 ENCOUNTER — Other Ambulatory Visit: Payer: Self-pay

## 2022-09-04 DIAGNOSIS — M5412 Radiculopathy, cervical region: Secondary | ICD-10-CM | POA: Insufficient documentation

## 2022-09-06 ENCOUNTER — Other Ambulatory Visit: Payer: Self-pay

## 2022-09-06 ENCOUNTER — Ambulatory Visit: Payer: Self-pay | Admitting: Student in an Organized Health Care Education/Training Program

## 2022-09-06 VITALS — BP 135/67 | HR 83 | Temp 97.7°F | Resp 18

## 2022-09-06 DIAGNOSIS — Y990 Civilian activity done for income or pay: Secondary | ICD-10-CM

## 2022-09-06 DIAGNOSIS — W19XXXA Unspecified fall, initial encounter: Secondary | ICD-10-CM

## 2022-09-06 DIAGNOSIS — M25562 Pain in left knee: Secondary | ICD-10-CM

## 2022-09-06 DIAGNOSIS — M25561 Pain in right knee: Secondary | ICD-10-CM
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
## 2022-09-10 ENCOUNTER — Encounter: Payer: Self-pay | Admitting: Orthopedic Surgery

## 2022-09-10 ENCOUNTER — Ambulatory Visit
Admission: RE | Admit: 2022-09-10 | Discharge: 2022-09-10 | Disposition: A | Discharge status: Home / Self Care | Payer: BLUE CROSS/BLUE SHIELD | Source: Ambulatory Visit

## 2022-09-10 ENCOUNTER — Ambulatory Visit: Payer: BLUE CROSS/BLUE SHIELD | Admitting: Orthopedic Surgery

## 2022-09-10 ENCOUNTER — Other Ambulatory Visit: Payer: Self-pay

## 2022-09-10 VITALS — BP 127/92 | HR 84 | Ht 66.0 in | Wt 135.0 lb

## 2022-09-10 DIAGNOSIS — M7541 Impingement syndrome of right shoulder: Secondary | ICD-10-CM

## 2022-09-10 DIAGNOSIS — M7551 Bursitis of right shoulder: Secondary | ICD-10-CM

## 2022-09-10 DIAGNOSIS — M67911 Unspecified disorder of synovium and tendon, right shoulder: Secondary | ICD-10-CM

## 2022-09-10 DIAGNOSIS — M25511 Pain in right shoulder: Secondary | ICD-10-CM

## 2022-09-10 DIAGNOSIS — M25311 Other instability, right shoulder: Secondary | ICD-10-CM | POA: Insufficient documentation
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
## 2022-09-19 ENCOUNTER — Ambulatory Visit: Payer: BLUE CROSS/BLUE SHIELD | Admitting: Orthopedic Surgery

## 2022-09-19 ENCOUNTER — Encounter: Payer: Self-pay | Admitting: Orthopedic Surgery

## 2022-09-19 ENCOUNTER — Other Ambulatory Visit: Payer: Self-pay

## 2022-09-19 VITALS — BP 114/70 | HR 65 | Ht 66.0 in | Wt 135.0 lb

## 2022-09-19 DIAGNOSIS — S161XXA Strain of muscle, fascia and tendon at neck level, initial encounter: Secondary | ICD-10-CM

## 2022-09-19 DIAGNOSIS — M5412 Radiculopathy, cervical region: Secondary | ICD-10-CM
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
## 2022-10-06 ENCOUNTER — Other Ambulatory Visit: Payer: Self-pay | Admitting: Orthopedic Surgery

## 2022-10-28 ENCOUNTER — Ambulatory Visit: Payer: Self-pay | Admitting: Rehabilitative and Restorative Service Providers"

## 2022-10-29 ENCOUNTER — Ambulatory Visit
Payer: BLUE CROSS/BLUE SHIELD | Attending: Orthopedic Surgery | Admitting: Rehabilitative and Restorative Service Providers"

## 2022-10-29 DIAGNOSIS — M5412 Radiculopathy, cervical region: Secondary | ICD-10-CM | POA: Insufficient documentation

## 2022-10-29 DIAGNOSIS — M25311 Other instability, right shoulder: Secondary | ICD-10-CM | POA: Insufficient documentation
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
## 2022-11-14 NOTE — Progress Notes (Deleted)
[facility] Orthopedic Sports/Spine Rehabilitation  PT Treatment Note      Name: Rachel Jimenez  DOB: Jan 28, 1996  Referring Physician: Scotty Court, MD,PhD  Diagnosis: No diagnosis found.      Subjective:  Pain Assessment: {PAIN SCALE:23261}  {PT/OT Subjective:23896}       Objective:  Cervical ROM Loss and Movement Quality  Flexion: minor loss, Normal movement  Extension:  moderate loss, Aberrant movement and Painful  Left Sidebend: minor loss, Normal movement  Right Sidebend:  minor loss, Normal movement  Left Rotation:  no loss in motion, Painful  Right Rotation:  no loss in motion, Painful             AROM AROM PROM PROM     Right Left Right Left   Shoulder            Forward Flexion 180* 180*       Scaption           ER at 0 ABD 80* 80       ER at 20 ABD           ER at 45 ABD           ER at 90 ABD                       IR at 0 ABD           IR at 45 ABD           IR at 90 ABD           Apley Functional IR T6* T6       *denotes pain             Strength - {Strength:26243}  Function: - {Improved/Worse/unchg:26239}  Education:  {Education:26246}      Treatment:  {Modalities:26249}  Seated cervical retraction 5 sec hold x 10   Supine cervical nods 5 sec hold x 10   Supine cervical lateral flexion isometric 5 sec hold x 10         SL ER 5 sec hold x 10   Prone scapular retraction 5 sec hold x 10                                    Assessment:   ***       Plan of Care:  Continue per Plan of care -  {Plan:26262}    Thank you for referring this patient to [facility] Orthopa[facility] - Sports and Spine Rehabilitation    Phoebe Sharps, PT,DPT       Minutes   Time Based CPT  Physical Performance test, Therapeutic Exercise, Therapeutic Activities, NM Re-Education, Manual Therapy, Gait Training, Massage, Aquatic Therapy, Canalith Repositioning, Iontophoresis, Ultrasound, Orthotic fitting/training, Prosthetic fitting ***   Service Based (untimed) CPT  PT/OT evaluation, PT/OT re-eval, E-stim-unattended,  Mechanical traction, Vasopneumatic device    Unbilled time (rest, etc)        Total Treatment Time

## 2022-11-15 ENCOUNTER — Ambulatory Visit: Payer: BLUE CROSS/BLUE SHIELD | Admitting: Rehabilitative and Restorative Service Providers"

## 2022-11-22 ENCOUNTER — Ambulatory Visit: Payer: Self-pay | Admitting: Rehabilitative and Restorative Service Providers"

## 2022-11-22 NOTE — Progress Notes (Deleted)
[place] [facility]/Sp[facility]  PT Treatment Note      Name: Rachel Jimenez  DOB: 04/21/1996  Referring Physician: Mannava, Sandeep, MD,PhD  Diagnosis: No diagnosis found.      Subjective:  Pain Assessment: {PAIN SCALE:23261}  {PT/OT Subjective:23896}       Objective:  Cervical ROM Loss and Movement Quality  Flexion: minor loss, Normal movement  Extension:  moderate loss, Aberrant movement and Painful  Left Sidebend: minor loss, Normal movement  Right Sidebend:  minor loss, Normal movement  Left Rotation:  no loss in motion, Painful  Right Rotation:  no loss in motion, Painful             AROM AROM PROM PROM     Right Left Right Left   Shoulder            Forward Flexion 180* 180*       Scaption           ER at 0 ABD 80* 80       ER at 20 ABD           ER at 45 ABD           ER at 90 ABD                       IR at 0 ABD           IR at 45 ABD           IR at 90 ABD           Apley Functional IR T6* T6       *denotes pain             Strength - {Strength:26243}  Function: - {Improved/Worse/unchg:26239}  Education:  {Education:26246}      Treatment:  {Modalities:26249}  Seated cervical retraction 5 sec hold x 10   Supine cervical nods 5 sec hold x 10   Supine cervical lateral flexion isometric 5 sec hold x 10         SL ER 5 sec hold x 10   Prone scapular retraction 5 sec hold x 10                                    Assessment:   ***       Plan of Care:  Continue per Plan of care -  {Plan:26262}    Thank you for referring this patient to [facility] Orthopa[facility] - Sports and Spine Rehabilitation     , PT,DPT       Minutes   Time Based CPT  Physical Performance test, Therapeutic Exercise, Therapeutic Activities, NM Re-Education, Manual Therapy, Gait Training, Massage, Aquatic Therapy, Canalith Repositioning, Iontophoresis, Ultrasound, Orthotic fitting/training, Prosthetic fitting ***   Service Based (untimed) CPT  PT/OT evaluation, PT/OT re-eval, E-stim-unattended,  Mechanical traction, Vasopneumatic device    Unbilled time (rest, etc)        Total Treatment Time

## 2022-12-23 ENCOUNTER — Other Ambulatory Visit: Payer: Self-pay | Admitting: Orthopedic Surgery

## 2023-01-27 ENCOUNTER — Other Ambulatory Visit: Payer: Self-pay

## 2023-01-27 ENCOUNTER — Encounter: Payer: Self-pay | Admitting: Orthopedic Surgery

## 2023-01-27 ENCOUNTER — Ambulatory Visit: Payer: Worker's Compensation | Attending: Orthopedic Surgery | Admitting: Orthopedic Surgery

## 2023-01-27 VITALS — BP 130/85 | HR 86 | Ht 66.0 in | Wt 135.0 lb

## 2023-01-27 DIAGNOSIS — S22009A Unspecified fracture of unspecified thoracic vertebra, initial encounter for closed fracture: Secondary | ICD-10-CM
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
## 2023-02-07 ENCOUNTER — Ambulatory Visit: Payer: No Typology Code available for payment source | Admitting: Orthopedic Surgery

## 2023-03-07 ENCOUNTER — Other Ambulatory Visit: Payer: Self-pay | Admitting: Orthopedic Surgery

## 2023-03-12 ENCOUNTER — Other Ambulatory Visit: Payer: Self-pay

## 2023-03-12 ENCOUNTER — Emergency Department: Payer: BLUE CROSS/BLUE SHIELD

## 2023-03-12 ENCOUNTER — Emergency Department
Admission: EM | Admit: 2023-03-12 | Discharge: 2023-03-12 | Disposition: A | Discharge status: Home / Self Care | Payer: BLUE CROSS/BLUE SHIELD | Source: Ambulatory Visit | Attending: Emergency Medicine | Admitting: Emergency Medicine

## 2023-03-12 DIAGNOSIS — S299XXA Unspecified injury of thorax, initial encounter: Secondary | ICD-10-CM

## 2023-03-12 DIAGNOSIS — F17290 Nicotine dependence, other tobacco product, uncomplicated: Secondary | ICD-10-CM | POA: Insufficient documentation

## 2023-03-12 DIAGNOSIS — S3991XA Unspecified injury of abdomen, initial encounter: Secondary | ICD-10-CM

## 2023-03-12 DIAGNOSIS — Y990 Civilian activity done for income or pay: Secondary | ICD-10-CM | POA: Insufficient documentation

## 2023-03-12 DIAGNOSIS — R0789 Other chest pain: Secondary | ICD-10-CM | POA: Insufficient documentation

## 2023-03-12 DIAGNOSIS — Y9389 Activity, other specified: Secondary | ICD-10-CM | POA: Insufficient documentation

## 2023-03-12 DIAGNOSIS — R000 Tachycardia, unspecified: Secondary | ICD-10-CM | POA: Insufficient documentation

## 2023-03-12 DIAGNOSIS — S301XXA Contusion of abdominal wall, initial encounter: Secondary | ICD-10-CM

## 2023-03-12 DIAGNOSIS — R110 Nausea: Secondary | ICD-10-CM | POA: Insufficient documentation

## 2023-03-12 DIAGNOSIS — R1031 Right lower quadrant pain: Secondary | ICD-10-CM | POA: Insufficient documentation

## 2023-03-12 DIAGNOSIS — Y9289 Other specified places as the place of occurrence of the external cause: Secondary | ICD-10-CM | POA: Insufficient documentation

## 2023-03-12 DIAGNOSIS — R1032 Left lower quadrant pain: Secondary | ICD-10-CM | POA: Insufficient documentation

## 2023-03-12 DIAGNOSIS — S20212A Contusion of left front wall of thorax, initial encounter: Secondary | ICD-10-CM

## 2023-03-12 DIAGNOSIS — R1012 Left upper quadrant pain: Secondary | ICD-10-CM | POA: Insufficient documentation

## 2023-03-12 LAB — CBC AND DIFFERENTIAL
Baso # K/uL: 0.1 10*3/uL (ref 0.0–0.2)
Eos # K/uL: 0.4 10*3/uL (ref 0.0–0.5)
Hematocrit: 38 % (ref 34–49)
Hemoglobin: 13.3 g/dL (ref 11.2–16.0)
IMM Granulocytes #: 0 10*3/uL (ref 0.0–0.0)
IMM Granulocytes: 0.1 %
Lymph # K/uL: 1.9 10*3/uL (ref 1.0–5.0)
MCV: 85 fL (ref 75–100)
Mono # K/uL: 0.6 10*3/uL (ref 0.1–1.0)
Neut # K/uL: 6.2 10*3/uL (ref 1.5–6.5)
Nucl RBC # K/uL: 0 10*3/uL (ref 0.0–0.0)
Nucl RBC %: 0 /100 WBC (ref 0.0–0.2)
Platelets: 248 10*3/uL (ref 150–450)
RBC: 4.5 MIL/uL (ref 4.0–5.5)
RDW: 12.3 % (ref 0.0–15.0)
Seg Neut %: 67 %
WBC: 9.2 10*3/uL (ref 3.5–11.0)

## 2023-03-12 LAB — PLASMA PROF 7 (ED ONLY)
Anion Gap,PL: 10 (ref 7–16)
CO2,Plasma: 26 mmol/L (ref 20–28)
Chloride,Plasma: 103 mmol/L (ref 96–108)
Creatinine: 1.02 mg/dL — ABNORMAL HIGH (ref 0.51–0.95)
Glucose,Plasma: 83 mg/dL (ref 60–99)
Potassium,Plasma: 3.8 mmol/L (ref 3.3–4.6)
Sodium,Plasma: 139 mmol/L (ref 133–145)
UN,Plasma: 11 mg/dL (ref 6–20)
eGFR BY CREAT: 78 *

## 2023-03-12 LAB — HOLD SST

## 2023-03-12 MED ORDER — SODIUM CHLORIDE 0.9 % FLUSH FOR PUMPS *I*
0.0000 mL/h | INTRAVENOUS | Status: DC | PRN
Start: 2023-03-12 — End: 2023-03-12

## 2023-03-12 MED ORDER — MORPHINE SULFATE 4 MG/ML IV SOLN *WRAPPED*
4.0000 mg | Freq: Once | INTRAVENOUS | Status: AC
Start: 2023-03-12 — End: 2023-03-12
  Administered 2023-03-12: 4 mg via INTRAVENOUS
  Filled 2023-03-12: qty 1

## 2023-03-12 MED ORDER — DEXTROSE 5 % FLUSH FOR PUMPS *I*
0.0000 mL/h | INTRAVENOUS | Status: DC | PRN
Start: 2023-03-12 — End: 2023-03-12

## 2023-03-12 MED ORDER — IOHEXOL 350 MG/ML (OMNIPAQUE) IV SOLN 500ML BOTTLE *I*
1.0000 mL | Freq: Once | INTRAVENOUS | Status: AC
Start: 2023-03-12 — End: 2023-03-12
  Administered 2023-03-12: 88 mL via INTRAVENOUS

## 2023-03-12 MED ORDER — ONDANSETRON HCL 2 MG/ML IV SOLN *I*
4.0000 mg | Freq: Once | INTRAMUSCULAR | Status: AC
Start: 2023-03-12 — End: 2023-03-12
  Administered 2023-03-12: 4 mg via INTRAVENOUS
  Filled 2023-03-12: qty 2

## 2023-03-12 NOTE — Bed Hold Note (Signed)
Bed: OVF-11  Expected date:   Expected time:   Means of arrival:   Comments:  CP
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
## 2023-03-12 NOTE — ED Notes (Signed)
Discharge instructions reviewed with patient. All questions answered.    Safe ride home confirmed and appropriate clothing in-place.    Vital signs stable and all IV's removed.

## 2023-03-15 ENCOUNTER — Ambulatory Visit: Payer: Self-pay | Admitting: Rehabilitative and Restorative Service Providers"

## 2023-04-21 ENCOUNTER — Other Ambulatory Visit: Payer: Self-pay

## 2023-04-21 ENCOUNTER — Encounter: Payer: Self-pay | Admitting: Physician Assistant

## 2023-04-21 ENCOUNTER — Ambulatory Visit: Payer: BLUE CROSS/BLUE SHIELD | Attending: Physician Assistant | Admitting: Physician Assistant

## 2023-04-21 VITALS — BP 129/75 | HR 89 | Temp 97.7°F | Resp 18

## 2023-04-21 DIAGNOSIS — L089 Local infection of the skin and subcutaneous tissue, unspecified: Secondary | ICD-10-CM | POA: Insufficient documentation

## 2023-04-21 MED ORDER — SULFAMETHOXAZOLE-TRIMETHOPRIM 800-160 MG PO TABS *I*
1.0000 | ORAL_TABLET | Freq: Two times a day (BID) | ORAL | 0 refills | Status: AC
Start: 2023-04-21 — End: 2023-04-26

## 2023-04-21 NOTE — Patient Instructions (Signed)
PLEASE REVIEW ALL INSTRUCTIONS FOR DETAILS AND CONTENT CONTAINED IN THE DISCHARGE MATERIALS NOT COVERED AT DISCHARGE.    Thank you Rachel Jimenez for coming to [facility] for your health care concerns.    You were seen today for skin infection of L index finger.     Please follow plan as discussed including:    Medications: An antibiotic called bactrim was prescribed for you today.  Please finish the entire 5 day course.  Antibiotic risks and side effects reviewed.  Please take probiotic or yogurt daily.      As discussed, please monitor for any worsening swelling, redness/streaking, warmth, pain, fever, chills, numbness, tingling, weakness. If you have any worsening symptoms please call your primary care doctor or return here for further evaluation immediately.    If your condition changes and/or worsens please follow up with your primary doctor and/or return to the urgent care center.    In the event of an emergency please dial 911

## 2023-04-21 NOTE — UC Provider Note (Signed)
Chief Complaint:   Chief Complaint   Patient presents with    finger problem     Patient complains of possible infection to left hand index finger. Patient complains of pain, redness and swelling. S/S noted for a few days.        HPI:  27 y.o. female w hx as noted in chart presents to Urgent Care for concerns for finger infection x3-4d    Started off w a pustule to the dorsum of the L proximal phalanx    Started draining and now erythematous, swollen and tender    Denies weakness or numbness and tingling, fevers, aches, anorexia      VITALS:  BP 129/75   Pulse 89   Temp 36.5 C (97.7 F)   Resp 18   SpO2 100%     ROS: see HPI above      PHYSICAL EXAM:  Appearance: Well appearing, no acute distress   MSK: CMS intact of the UE, ROM of the L 2nd digit intact, grip str 5/5, no circumferential swelling of the L 2nd MCP or PIP joint  Skin: TTP    "I,Ying Blankenhorn, PA,certify that the patient has authorized the use of photography for the purpose of the provision of health care at the [facility] and affiliates and they understand that it will be included in the legal medical record."    MEDICAL DECISION MAKING:  Assessment: 27 y.o. female w hx as noted in chart presents to UC with skin infection.    Differential Diagnosis: Skin infection, cellulitis, abscess, pustule, finger injury    Plan and Results:     Orders Placed This Encounter    sulfamethoxazole-trimethoprim (BACTRIM DS,SEPTRA DS) 800-160 mg tablet       No results found for this or any previous visit (from the past 24 hours).      Diagnosis and Disposition:  1. Skin infection        Patient Instructions   PLEASE REVIEW ALL INSTRUCTIONS FOR DETAILS AND CONTENT CONTAINED IN THE DISCHARGE MATERIALS NOT COVERED AT DISCHARGE.    Thank you Herby Abraham for coming to [facility] for your health care concerns.    You were seen today for skin infection of L index finger.     Please follow plan as discussed including:    Medications: An  antibiotic called bactrim was prescribed for you today.  Please finish the entire 5 day course.  Antibiotic risks and side effects reviewed.  Please take probiotic or yogurt daily.      As discussed, please monitor for any worsening swelling, redness/streaking, warmth, pain, fever, chills, numbness, tingling, weakness. If you have any worsening symptoms please call your primary care doctor or return here for further evaluation immediately.    If your condition changes and/or worsens please follow up with your primary doctor and/or return to the urgent care center.    In the event of an emergency please dial 911       Encourage fluids, rest, & good hand hygiene.    Use over the counter medications as discussed.    Please start the new medications as below:    Start Taking             sulfamethoxazole-trimethoprim (BACTRIM DS,SEPTRA DS) 800-160 mg tablet   Take 1 tablet by mouth 2 times daily for 5 days for Infection of the Skin   and/or Soft Tissue.        Please follow up  with your physician as below:    No follow-ups on file.    Given the patient's reassuring vital signs and overall well appearance, the patient can be managed safely at home. Patient advised to call PCP or return to urgent care for any worsening or concerning symptoms as reviewed and provided on AVS.

## 2023-05-07 ENCOUNTER — Other Ambulatory Visit: Payer: Self-pay | Admitting: Orthopedic Surgery

## 2023-07-06 ENCOUNTER — Other Ambulatory Visit: Payer: Self-pay | Admitting: Orthopedic Surgery
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
## 2023-07-16 NOTE — Progress Notes (Signed)
 Adult Partial Hospitalization Program  Clinical Management Note     Rachel Jimenez  [id number]  07/16/2023    Triage Coordinator Note:  Obtained patient's correct phone number [phone] and left a message requesting that she return writer's call.

## 2023-07-16 NOTE — Progress Notes (Signed)
 Adult Partial Hospitalization Program  Clinical Management Note     Rachel Jimenez  [id number]  07/16/2023    Triage Coordinator Note:  I spoke with patient by phone.  She was unaware that she would need to attend the [facility] Monday through Friday from 8:30 AM to 3:00 PM for 10 treatment days.  She reports that she works 50 to 60 hours per week and is unable to commit to our hours.  She is aware that this referral will be closed and if her situation changes that she can be referred back.  Update provided to the referral source and referral closed.
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
## 2023-07-27 ENCOUNTER — Other Ambulatory Visit: Payer: Self-pay

## 2023-07-28 ENCOUNTER — Encounter: Payer: Self-pay | Admitting: Orthopedic Surgery

## 2023-07-28 ENCOUNTER — Ambulatory Visit: Payer: Worker's Compensation | Attending: Orthopedic Surgery | Admitting: Orthopedic Surgery

## 2023-07-28 VITALS — BP 126/86 | HR 88 | Ht 66.0 in | Wt 130.0 lb

## 2023-07-28 DIAGNOSIS — S22009A Unspecified fracture of unspecified thoracic vertebra, initial encounter for closed fracture: Secondary | ICD-10-CM

## 2023-07-28 MED ORDER — CYCLOBENZAPRINE HCL 5 MG PO TABS *I*
5.0000 mg | ORAL_TABLET | Freq: Three times a day (TID) | ORAL | 1 refills | Status: DC | PRN
Start: 2023-07-28 — End: 2024-02-05

## 2023-09-29 ENCOUNTER — Telehealth: Payer: Self-pay | Admitting: Orthopedic Surgery

## 2023-09-29 MED ORDER — MELOXICAM 15 MG PO TABS *I*
15.0000 mg | ORAL_TABLET | Freq: Every day | ORAL | 1 refills | Status: DC
Start: 2023-09-29 — End: 2023-12-25

## 2023-09-29 NOTE — Telephone Encounter (Signed)
 Hey,     Rachel Jimenez needs a refill of her Meloxicam.    Thanks!  KT

## 2023-09-29 NOTE — Addendum Note (Signed)
 Addended by: Silvestre Gunner on: 09/29/2023 09:20 AM     Modules accepted: Orders

## 2023-10-27 ENCOUNTER — Ambulatory Visit: Payer: Self-pay | Admitting: Podiatry

## 2023-10-27 ENCOUNTER — Other Ambulatory Visit: Payer: Self-pay

## 2023-10-28 ENCOUNTER — Ambulatory Visit: Payer: PRIVATE HEALTH INSURANCE | Admitting: Student in an Organized Health Care Education/Training Program

## 2023-10-28 ENCOUNTER — Encounter: Payer: Self-pay | Admitting: Student in an Organized Health Care Education/Training Program

## 2023-10-28 VITALS — BP 110/70 | HR 87 | Resp 16 | Ht 66.0 in | Wt 134.0 lb

## 2023-10-28 DIAGNOSIS — F3181 Bipolar II disorder: Secondary | ICD-10-CM

## 2023-10-28 DIAGNOSIS — Z0000 Encounter for general adult medical examination without abnormal findings: Secondary | ICD-10-CM

## 2023-10-28 NOTE — Progress Notes (Signed)
 Yearly Preventative Physical Exam    HPI:   28 year old female presenting today for new patient preventative exam.  She has no acute complaints or concerns.    Care Team Reviewed    Diet: Good  Exercise: Active  Medications: Reviewed with patient and updated in chart   Medical History - reviewed and updated in chart   Social History - reviewed and updated in chart  Sexual Health: reviewed and updated in chart   Anxiety/Depression Screening Reviewed and nonactionable -is following with psychiatry    ROS:  ROS negative except as mentioned in HPI    Objective:  Constitutional:       General: No acute distress.     Appearance: Normal appearance.   HENT:      Head: Normocephalic and atraumatic.      Nose: Nose normal.      Mouth/Throat:      Mouth: Mucous membranes are moist.   Eyes:      General: No scleral icterus.     Extraocular Movements: Extraocular movements intact.      Conjunctiva/sclera: Conjunctivae normal.      Pupils: Pupils are equal, round, and reactive to light.   Cardiovascular:      Rate and Rhythm: Normal rate and regular rhythm.      Heart sounds: No murmur heard.     No friction rub. No gallop.   Pulmonary:      Effort: No respiratory distress.      Breath sounds: No stridor. No wheezing, rhonchi or rales.   Abdominal:      General: Abdomen is flat. Bowel sounds are normal. There is no distension.      Palpation: Abdomen is soft. There is no mass.      Tenderness: There is no abdominal tenderness. There is no right CVA tenderness or left CVA tenderness.   Musculoskeletal:         General: No tenderness or deformity. Normal range of motion.      Cervical back: Normal range of motion. No tenderness.      Right lower leg: No edema.      Left lower leg: No edema.   Lymphadenopathy:      Cervical: No cervical adenopathy.   Skin:     General: Skin is warm and dry.      Capillary Refill: Capillary refill takes less than 2 seconds.   Neurological:      General: No focal deficit present.      Mental Status:  Alert and oriented to person, place, and time.      Cranial Nerves: No cranial nerve deficit.      Sensory: No sensory deficit.      Coordination: Coordination normal.      Gait: Gait normal.      Deep Tendon Reflexes: Reflexes normal.   Psychiatric:         Mood and Affect: Mood normal.         Behavior: Behavior normal.         Thought Content: Thought content normal.         Judgment: Judgment normal.         Assessment/Plan    Yearly Preventative Exam  - Updated histories and medications as above  - Anticipatory guidance discussed with patient   - Care gaps addressed   - Counseled on healthy diet and of activity daily  - Declines STD Testing, HIV and HCV at next indicated lab draw   -  Counseled on Skin Cancer Prevention  - pap smear last year per patient, last records in chart from 2022. Will plan to repeat pap next year    1.  Bipolar 2  Chronic well-controlled and following with psychiatry will continue with lamotrigine  for mood stabilization and Latuda for now.  Will check metabolic screening labs given use of antipsychotics prior to visit next year.

## 2023-10-31 ENCOUNTER — Encounter: Payer: Self-pay | Admitting: Student in an Organized Health Care Education/Training Program

## 2023-11-04 ENCOUNTER — Telehealth: Payer: Self-pay | Admitting: Student in an Organized Health Care Education/Training Program

## 2023-11-04 NOTE — Telephone Encounter (Signed)
 Spoke to patient re sx.  She has flushing that turns red and then purple in her face.  The pain has been getting worse tingling and a burning sensation.  I offered her several appt times, but she declined due to work.  Thursday was her availability for this week.  She will call for a same day visit when she can come in.  Thank you

## 2023-12-25 ENCOUNTER — Other Ambulatory Visit: Payer: Self-pay | Admitting: Orthopedic Surgery

## 2023-12-29 ENCOUNTER — Ambulatory Visit: Payer: Self-pay | Admitting: Orthopedic Surgery
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
## 2024-01-23 ENCOUNTER — Ambulatory Visit: Payer: Worker's Compensation | Admitting: Orthopedic Surgery
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
## 2024-02-05 ENCOUNTER — Other Ambulatory Visit: Payer: Self-pay

## 2024-02-05 ENCOUNTER — Ambulatory Visit: Payer: Worker's Compensation | Attending: Orthopedic Surgery | Admitting: Orthopedic Surgery

## 2024-02-05 ENCOUNTER — Encounter: Payer: Self-pay | Admitting: Orthopedic Surgery

## 2024-02-05 VITALS — BP 134/83 | HR 92 | Ht 66.0 in | Wt 118.0 lb

## 2024-02-05 DIAGNOSIS — S22009A Unspecified fracture of unspecified thoracic vertebra, initial encounter for closed fracture: Secondary | ICD-10-CM

## 2024-02-05 MED ORDER — CYCLOBENZAPRINE HCL 5 MG PO TABS *I*
5.0000 mg | ORAL_TABLET | Freq: Three times a day (TID) | ORAL | 1 refills | Status: AC | PRN
Start: 2024-02-05 — End: ?

## 2024-02-22 ENCOUNTER — Other Ambulatory Visit: Payer: Self-pay | Admitting: Orthopedic Surgery

## 2024-02-24 ENCOUNTER — Other Ambulatory Visit: Payer: Self-pay | Admitting: Student in an Organized Health Care Education/Training Program

## 2024-02-24 ENCOUNTER — Encounter: Payer: Self-pay | Admitting: Student in an Organized Health Care Education/Training Program

## 2024-05-09 ENCOUNTER — Other Ambulatory Visit: Payer: Self-pay | Admitting: Orthopedic Surgery

## 2024-07-10 ENCOUNTER — Other Ambulatory Visit: Payer: Self-pay | Admitting: Orthopedic Surgery

## 2024-07-26 ENCOUNTER — Encounter: Payer: Self-pay | Admitting: Student in an Organized Health Care Education/Training Program

## 2024-08-09 ENCOUNTER — Ambulatory Visit: Payer: Worker's Compensation | Admitting: Orthopedic Surgery

## 2024-10-29 ENCOUNTER — Ambulatory Visit: Payer: Self-pay | Admitting: Student in an Organized Health Care Education/Training Program
